# Patient Record
Sex: Female | Born: 2000 | Race: Black or African American | Hispanic: No | Smoking: Never smoker
Health system: Southern US, Community
[De-identification: ages and names within clinical notes are randomized; demographics above are authoritative.]

## PROBLEM LIST (undated history)

## (undated) DIAGNOSIS — Z789 Other specified health status: Secondary | ICD-10-CM

## (undated) DIAGNOSIS — D649 Anemia, unspecified: Secondary | ICD-10-CM

## (undated) DIAGNOSIS — A749 Chlamydial infection, unspecified: Secondary | ICD-10-CM

## (undated) DIAGNOSIS — R519 Headache, unspecified: Secondary | ICD-10-CM

## (undated) DIAGNOSIS — N390 Urinary tract infection, site not specified: Secondary | ICD-10-CM

## (undated) HISTORY — PX: NO PAST SURGERIES: SHX2092

## (undated) HISTORY — PX: THERAPEUTIC ABORTION: SHX798

---
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
## 2019-10-17 NOTE — L&D Delivery Note (Signed)
Delivery Note  Date of delivery: 05/02/2020 Estimated Date of Delivery: 05/08/20 Patient's last menstrual period was 08/02/2019. EGA: [redacted]w[redacted]d  Delivery Note At 9:17 AM a viable female was delivered via Vaginal, Spontaneous (Presentation: Left Occiput Anterior).  APGAR: 8, 9; weight  pending.   Placenta status: Spontaneous, Intact.  Cord: 3 vessels with the following complications: None.     First Stage: Labor onset: 0630 Augmentation : Pitocin Analgesia /Anesthesia intrapartum: Epidural SROM at 0545  Carol Schmitt presented to L&D with elevated BP. She was augmented with pitocin. Epidural placed.   Second Stage: Complete dilation at 0900 Onset of pushing at 0908 FHR second stage Cat II Delivery at 0917 on 05/02/2020  She progressed to complete and had a spontaneous vaginal birth of a live female over an intact perineum. The fetal head was delivered in OA position with restitution to LOA. No nuchal cord. 30 sec shoulder dystocia noted with resolution using McRoberts and suprapubic pressure. Anterior then posterior shoulders delivered. Baby placed on mom's abdomen and attended to by transition RN. Cord clamped and cut when pulseless by FOB.   Third Stage: Placenta delivered intact with 3VC at 0929 Placenta disposition: routine disposal Uterine tone boggy / bleeding heavy IV pitocin given  1000mg  Cytotec Stage 1 PPH order set started Bleeding resolved and uterine tone was firm and bleeding appropriate  Periurethreal skidmark laceration identified  Anesthesia for repair: Epidural Repair none Est. Blood Loss (mL): 1745  Complications: Shoulder dystocia and PPH  Mom to postpartum.  Baby to Couplet care / Skin to Skin.  Newborn: Birth Weight: pending  Apgar Scores: 8,9 Feeding planned: breastfeeding   , CNM 05/02/2020 9:52 AM

## 2019-12-02 ENCOUNTER — Other Ambulatory Visit: Payer: Self-pay | Admitting: Physician Assistant

## 2019-12-02 DIAGNOSIS — Z3201 Encounter for pregnancy test, result positive: Secondary | ICD-10-CM

## 2020-01-15 ENCOUNTER — Ambulatory Visit: Payer: Medicaid Other

## 2020-02-11 LAB — OB RESULTS CONSOLE HIV ANTIBODY (ROUTINE TESTING): HIV: NONREACTIVE

## 2020-02-11 LAB — OB RESULTS CONSOLE RPR: RPR: NONREACTIVE

## 2020-03-01 ENCOUNTER — Other Ambulatory Visit: Payer: Self-pay

## 2020-03-01 ENCOUNTER — Ambulatory Visit
Admission: RE | Admit: 2020-03-01 | Discharge: 2020-03-01 | Disposition: A | Discharge status: Home / Self Care | Payer: Medicaid Other | Source: Ambulatory Visit | Attending: Physician Assistant | Admitting: Physician Assistant

## 2020-03-01 DIAGNOSIS — Z3201 Encounter for pregnancy test, result positive: Secondary | ICD-10-CM | POA: Diagnosis present

## 2020-03-24 ENCOUNTER — Observation Stay
Admission: EM | Admit: 2020-03-24 | Discharge: 2020-03-24 | Disposition: A | Discharge status: Home / Self Care | Payer: Medicaid Other | Attending: Obstetrics and Gynecology | Admitting: Obstetrics and Gynecology

## 2020-03-24 ENCOUNTER — Other Ambulatory Visit: Payer: Self-pay

## 2020-03-24 DIAGNOSIS — R1030 Lower abdominal pain, unspecified: Secondary | ICD-10-CM | POA: Diagnosis present

## 2020-03-24 DIAGNOSIS — Z3A33 33 weeks gestation of pregnancy: Secondary | ICD-10-CM | POA: Diagnosis not present

## 2020-03-24 DIAGNOSIS — O26893 Other specified pregnancy related conditions, third trimester: Secondary | ICD-10-CM | POA: Diagnosis not present

## 2020-03-24 LAB — URINALYSIS, COMPLETE (UACMP) WITH MICROSCOPIC
Bilirubin Urine: NEGATIVE
Glucose, UA: NEGATIVE mg/dL
Ketones, ur: NEGATIVE mg/dL
Nitrite: NEGATIVE
Protein, ur: 30 mg/dL — AB
Specific Gravity, Urine: 1.026 (ref 1.005–1.030)
pH: 6 (ref 5.0–8.0)

## 2020-03-24 LAB — WET PREP, GENITAL
Clue Cells Wet Prep HPF POC: NONE SEEN
Sperm: NONE SEEN
Trich, Wet Prep: NONE SEEN

## 2020-03-24 MED ORDER — TERCONAZOLE 0.4 % VA CREA: 1.0000 | TOPICAL_CREAM | Freq: Every day | VAGINAL | 0 refills | Status: AC

## 2020-03-24 MED ORDER — ACETAMINOPHEN 325 MG PO TABS: 650.0000 mg | ORAL_TABLET | ORAL | Status: DC | PRN

## 2020-03-24 NOTE — Progress Notes (Signed)
Spoke to [facility] and requested prenatal records. They stated that they would fax prenatal records to Labor and Delivery and upload to the patient's chart.

## 2020-03-24 NOTE — OB Triage Note (Signed)
Patient G1P0 [redacted]w[redacted]d (pt reported) presents to labor and delivery with complaints of discomfort in groin area since 0600. Patient states she has the pain when she is walking and with certain movements. She denies contractions, leaking of fluid and vaginal bleeding and reports + fetal movement. Patient reports receiving prenatal care at [facility]. Monitors applied and assessing and vital signs stable. Patient denies any further needs at this time.

## 2020-03-24 NOTE — Discharge Instructions (Signed)

## 2020-03-24 NOTE — Progress Notes (Signed)
RN reviewed discharge instructions with patient and patient verbalized understanding. Patient had no further questions.

## 2020-03-24 NOTE — Discharge Summary (Addendum)
Patient ID: MAYURI STAPLES MRN: [id number] DOB/AGE: Aug 16, 2001 19 y.o.  Admit date: 03/24/2020 Discharge date: 03/24/2020  Admission Diagnoses: Lower abdominal pain with movement, vulvar pain with wiping after a void  Discharge Diagnoses: RL pain and yeast infection  Prenatal Procedures: NST and labs  Consults: None  Significant Diagnostic Studies:  Results for orders placed or performed during the hospital encounter of 03/24/20 (from the past 168 hour(s))  Wet prep, genital   Collection Time: 03/24/20 11:02 AM  Result Value Ref Range   Yeast Wet Prep HPF POC PRESENT (A) NONE SEEN   Trich, Wet Prep NONE SEEN NONE SEEN   Clue Cells Wet Prep HPF POC NONE SEEN NONE SEEN   WBC, Wet Prep HPF POC MANY (A) NONE SEEN   Sperm NONE SEEN   Urinalysis, Complete w Microscopic   Collection Time: 03/24/20 11:02 AM  Result Value Ref Range   Color, Urine YELLOW (A) YELLOW   APPearance CLOUDY (A) CLEAR   Specific Gravity, Urine 1.026 1.005 - 1.030   pH 6.0 5.0 - 8.0   Glucose, UA NEGATIVE NEGATIVE mg/dL   Hgb urine dipstick SMALL (A) NEGATIVE   Bilirubin Urine NEGATIVE NEGATIVE   Ketones, ur NEGATIVE NEGATIVE mg/dL   Protein, ur 30 (A) NEGATIVE mg/dL   Nitrite NEGATIVE NEGATIVE   Leukocytes,Ua LARGE (A) NEGATIVE   RBC / HPF 6-10 0 - 5 RBC/hpf   WBC, UA 11-20 0 - 5 WBC/hpf   Bacteria, UA RARE (A) NONE SEEN   Squamous Epithelial / LPF 11-20 0 - 5   Mucus PRESENT     Treatments: Rx for [facility] Hospital Course:  This is a 19 y.o. G1P0 with IUP at [redacted]w[redacted]d seen for lower abdominal pain with movement, an dvulvar pain with wiping after a void.  Pt reported no leaking of fluid, bleeding or UCs.  Wet prep and UA/culture collected.    She was observed, fetal heart rate monitoring remained reassuring, and she had no signs/symptoms of preterm labor or other maternal-fetal concerns.  She was deemed stable for discharge to home with outpatient follow up with CD - appt with them already scheduled for  6/16.  Discharge Physical Exam:  BP 128/78 (BP Location: Right Arm)   Pulse 72   Temp 98.1 F (36.7 C) (Oral)   Resp 16   Ht 5\' 3"  (1.6 m)   Wt 69.9 kg   BMI 27.28 kg/m   General: NAD CV: RRR Pulm: nl effort ABD: s/nd/nt, gravid DVT Evaluation: LE non-ttp, no evidence of DVT on exam.  SVE: deferred FHT: FHR: 135 bpm, variability: moderate,  accelerations:  Present,  decelerations:  Absent Category/reactivity:  Appropriate for GA TOCO: Occasional    Discharge Condition: Stable  Disposition: Discharge disposition: 01-Home or Self Care        Allergies as of 03/24/2020      Reactions   Apple Other (See Comments)   Mouth discomfort      Medication List    TAKE these medications   multivitamin-prenatal 27-0.8 MG Tabs tablet Take 1 tablet by mouth daily at 12 noon.   terconazole 0.4 % vaginal cream Commonly known as: Terazol 7 Place 1 applicator vaginally at bedtime for 7 days.      Follow-up [place], [facility] Call in 1 day(s).   Specialty: General Practice Contact information: [place] [address] [phone]           Signed:  Haroldine Laws, CNM 03/24/2020 11:49 AM

## 2020-03-25 LAB — URINE CULTURE: Culture: 20000 — AB

## 2020-03-26 ENCOUNTER — Other Ambulatory Visit: Payer: Self-pay | Admitting: Physician Assistant

## 2020-03-26 DIAGNOSIS — Z3403 Encounter for supervision of normal first pregnancy, third trimester: Secondary | ICD-10-CM

## 2020-03-29 ENCOUNTER — Telehealth: Payer: Self-pay | Admitting: Radiology

## 2020-04-06 ENCOUNTER — Other Ambulatory Visit: Payer: Self-pay | Admitting: Physician Assistant

## 2020-04-06 DIAGNOSIS — IMO0002 Reserved for concepts with insufficient information to code with codable children: Secondary | ICD-10-CM

## 2020-04-06 DIAGNOSIS — Z3403 Encounter for supervision of normal first pregnancy, third trimester: Secondary | ICD-10-CM

## 2020-04-07 ENCOUNTER — Ambulatory Visit: Admission: RE | Admit: 2020-04-07 | Payer: Medicaid Other | Source: Ambulatory Visit

## 2020-04-15 ENCOUNTER — Ambulatory Visit: Payer: Medicaid Other

## 2020-04-23 ENCOUNTER — Ambulatory Visit: Admission: RE | Admit: 2020-04-23 | Payer: Medicaid Other | Source: Ambulatory Visit

## 2020-05-01 ENCOUNTER — Other Ambulatory Visit: Payer: Self-pay

## 2020-05-01 ENCOUNTER — Inpatient Hospital Stay
Admission: EM | Admit: 2020-05-01 | Discharge: 2020-05-04 | DRG: 806 | Disposition: A | Discharge status: Home / Self Care | Payer: Medicaid Other | Attending: Obstetrics and Gynecology | Admitting: Obstetrics and Gynecology

## 2020-05-01 DIAGNOSIS — D62 Acute posthemorrhagic anemia: Secondary | ICD-10-CM | POA: Diagnosis not present

## 2020-05-01 DIAGNOSIS — Z20822 Contact with and (suspected) exposure to covid-19: Secondary | ICD-10-CM | POA: Diagnosis present

## 2020-05-01 DIAGNOSIS — Z3A39 39 weeks gestation of pregnancy: Secondary | ICD-10-CM | POA: Diagnosis not present

## 2020-05-01 DIAGNOSIS — O9081 Anemia of the puerperium: Secondary | ICD-10-CM | POA: Diagnosis not present

## 2020-05-01 DIAGNOSIS — O1404 Mild to moderate pre-eclampsia, complicating childbirth: Principal | ICD-10-CM | POA: Diagnosis present

## 2020-05-01 DIAGNOSIS — O1490 Unspecified pre-eclampsia, unspecified trimester: Secondary | ICD-10-CM | POA: Diagnosis present

## 2020-05-01 DIAGNOSIS — O09299 Supervision of pregnancy with other poor reproductive or obstetric history, unspecified trimester: Secondary | ICD-10-CM | POA: Diagnosis present

## 2020-05-01 HISTORY — DX: Other specified health status: Z78.9

## 2020-05-01 LAB — COMPREHENSIVE METABOLIC PANEL
ALT: 13 U/L (ref 0–44)
AST: 16 U/L (ref 15–41)
Albumin: 2.8 g/dL — ABNORMAL LOW (ref 3.5–5.0)
Alkaline Phosphatase: 101 U/L (ref 38–126)
Anion gap: 8 (ref 5–15)
BUN: 7 mg/dL (ref 6–20)
CO2: 20 mmol/L — ABNORMAL LOW (ref 22–32)
Calcium: 8.5 mg/dL — ABNORMAL LOW (ref 8.9–10.3)
Chloride: 107 mmol/L (ref 98–111)
Creatinine, Ser: 0.72 mg/dL (ref 0.44–1.00)
GFR calc Af Amer: >60 mL/min (ref >60)
GFR calc non Af Amer: >60 mL/min (ref >60)
Glucose, Bld: 79 mg/dL (ref 70–99)
Potassium: 3.9 mmol/L (ref 3.5–5.1)
Sodium: 135 mmol/L (ref 135–145)
Total Bilirubin: 0.6 mg/dL (ref 0.3–1.2)
Total Protein: 6.2 g/dL — ABNORMAL LOW (ref 6.5–8.1)

## 2020-05-01 LAB — CBC
HCT: 28.4 % — ABNORMAL LOW (ref 36.0–46.0)
Hemoglobin: 9.7 g/dL — ABNORMAL LOW (ref 12.0–15.0)
MCH: 28.4 pg (ref 26.0–34.0)
MCHC: 34.2 g/dL (ref 30.0–36.0)
MCV: 83.3 fL (ref 80.0–100.0)
Platelets: 147 10*3/uL — ABNORMAL LOW (ref 150–400)
RBC: 3.41 MIL/uL — ABNORMAL LOW (ref 3.87–5.11)
RDW: 13.2 % (ref 11.5–15.5)
WBC: 7.5 10*3/uL (ref 4.0–10.5)
nRBC: 0 % (ref 0.0–0.2)

## 2020-05-01 LAB — OB RESULTS CONSOLE HIV ANTIBODY (ROUTINE TESTING): HIV: NONREACTIVE

## 2020-05-01 LAB — RAPID HIV SCREEN (HIV 1/2 AB+AG)
HIV 1/2 Antibodies: NONREACTIVE
HIV-1 P24 Antigen - HIV24: NONREACTIVE

## 2020-05-01 LAB — PROTEIN / CREATININE RATIO, URINE
Creatinine, Urine: 160 mg/dL
Protein Creatinine Ratio: 0.71 mg/mg{Cre} — ABNORMAL HIGH (ref 0.00–0.15)
Total Protein, Urine: 114 mg/dL

## 2020-05-01 LAB — OB RESULTS CONSOLE RPR: RPR: NONREACTIVE

## 2020-05-01 LAB — GROUP B STREP BY PCR: Group B strep by PCR: NEGATIVE

## 2020-05-01 LAB — SARS CORONAVIRUS 2 BY RT PCR (HOSPITAL ORDER, PERFORMED IN ~~LOC~~ HOSPITAL LAB): SARS Coronavirus 2: NEGATIVE

## 2020-05-01 LAB — ABO/RH: ABO/RH(D): B POS

## 2020-05-01 LAB — HEPATITIS B SURFACE ANTIGEN: Hepatitis B Surface Ag: NONREACTIVE

## 2020-05-01 MED ORDER — AMMONIA AROMATIC IN INHA
RESPIRATORY_TRACT | Status: AC
  Filled 2020-05-01: qty 10

## 2020-05-01 MED ORDER — MISOPROSTOL 25 MCG QUARTER TABLET: 25.0000 ug | ORAL_TABLET | ORAL | Status: DC

## 2020-05-01 MED ORDER — MISOPROSTOL 25 MCG QUARTER TABLET
ORAL_TABLET | ORAL | Status: AC
  Administered 2020-05-01: 25 ug via BUCCAL
  Filled 2020-05-01: qty 2

## 2020-05-01 MED ORDER — SOD CITRATE-CITRIC ACID 500-334 MG/5ML PO SOLN: 30.0000 mL | ORAL | Status: DC | PRN

## 2020-05-01 MED ORDER — OXYCODONE-ACETAMINOPHEN 5-325 MG PO TABS: 2.0000 | ORAL_TABLET | ORAL | Status: DC | PRN

## 2020-05-01 MED ORDER — MISOPROSTOL 200 MCG PO TABS
ORAL_TABLET | ORAL | Status: AC
  Administered 2020-05-01: 25 ug via VAGINAL
  Filled 2020-05-01: qty 4

## 2020-05-01 MED ORDER — LACTATED RINGERS IV SOLN
500.0000 mL | INTRAVENOUS | Status: DC | PRN
  Administered 2020-05-02: 250 mL via INTRAVENOUS

## 2020-05-01 MED ORDER — ACETAMINOPHEN 325 MG PO TABS: 650.0000 mg | ORAL_TABLET | ORAL | Status: DC | PRN

## 2020-05-01 MED ORDER — FENTANYL CITRATE (PF) 100 MCG/2ML IJ SOLN
INTRAMUSCULAR | Status: AC
  Administered 2020-05-02: 50 ug via INTRAVENOUS
  Filled 2020-05-01: qty 2

## 2020-05-01 MED ORDER — OXYTOCIN-SODIUM CHLORIDE 30-0.9 UT/500ML-% IV SOLN
1.0000 m[IU]/min | INTRAVENOUS | Status: DC
  Administered 2020-05-01: 2 m[IU]/min via INTRAVENOUS

## 2020-05-01 MED ORDER — PENICILLIN G POT IN DEXTROSE 60000 UNIT/ML IV SOLN: 3.0000 10*6.[IU] | INTRAVENOUS | Status: DC

## 2020-05-01 MED ORDER — CALCIUM CARBONATE ANTACID 500 MG PO CHEW: 2.0000 | CHEWABLE_TABLET | ORAL | Status: DC | PRN

## 2020-05-01 MED ORDER — TERBUTALINE SULFATE 1 MG/ML IJ SOLN: 0.2500 mg | Freq: Once | INTRAMUSCULAR | Status: DC | PRN

## 2020-05-01 MED ORDER — SODIUM CHLORIDE 0.9 % IV SOLN: 5.0000 10*6.[IU] | Freq: Once | INTRAVENOUS | Status: DC

## 2020-05-01 MED ORDER — OXYCODONE-ACETAMINOPHEN 5-325 MG PO TABS: 1.0000 | ORAL_TABLET | ORAL | Status: DC | PRN

## 2020-05-01 MED ORDER — OXYTOCIN BOLUS FROM INFUSION
333.0000 mL | Freq: Once | INTRAVENOUS | Status: AC
  Administered 2020-05-02: 333 mL via INTRAVENOUS

## 2020-05-01 MED ORDER — LIDOCAINE HCL (PF) 1 % IJ SOLN: 30.0000 mL | INTRAMUSCULAR | Status: DC | PRN

## 2020-05-01 MED ORDER — LIDOCAINE HCL (PF) 1 % IJ SOLN
INTRAMUSCULAR | Status: AC
  Filled 2020-05-01: qty 30

## 2020-05-01 MED ORDER — FENTANYL CITRATE (PF) 100 MCG/2ML IJ SOLN
50.0000 ug | INTRAMUSCULAR | Status: AC | PRN
  Administered 2020-05-01 – 2020-05-02 (×3): 50 ug via INTRAVENOUS
  Filled 2020-05-01 (×3): qty 2

## 2020-05-01 MED ORDER — ONDANSETRON HCL 4 MG/2ML IJ SOLN: 4.0000 mg | Freq: Four times a day (QID) | INTRAMUSCULAR | Status: DC | PRN

## 2020-05-01 MED ORDER — LACTATED RINGERS IV SOLN: INTRAVENOUS | Status: DC

## 2020-05-01 MED ORDER — MISOPROSTOL 25 MCG QUARTER TABLET: 25.0000 ug | ORAL_TABLET | ORAL | Status: DC | PRN

## 2020-05-01 MED ORDER — OXYTOCIN-SODIUM CHLORIDE 30-0.9 UT/500ML-% IV SOLN
2.5000 [IU]/h | INTRAVENOUS | Status: DC
  Administered 2020-05-02: 2.5 [IU]/h via INTRAVENOUS
  Filled 2020-05-01 (×2): qty 500

## 2020-05-01 MED ORDER — OXYTOCIN 10 UNIT/ML IJ SOLN
INTRAMUSCULAR | Status: AC
  Filled 2020-05-01: qty 2

## 2020-05-01 NOTE — OB Triage Provider Note (Addendum)
OB History & Physical   History of Present Illness:  Chief Complaint:   HPI:  Carol Schmitt is a 19 y.o. G1P0 female at [redacted]w[redacted]d dated by LMP.  She presents to L&D for labor check.  Upon admission she was admitted for Pre-eclampsia without severe features.  Prenatal records unavailable, however pt reports that at her last [facility] appt yesterday she was told she had elevated blood pressure at the time.  She reports:  -active fetal movement -no leakage of fluid -no vaginal bleeding -onset of contractions at 2000 currently every irregular   Maternal Medical History:   Past Medical History:  Diagnosis Date   Medical history non-contributory     Past Surgical History:  Procedure Laterality Date   NO PAST SURGERIES      Allergies  Allergen Reactions   Apple Other (See Comments)    Mouth discomfort    Prior to Admission medications   Medication Sig Start Date End Date Taking? Authorizing Provider  Prenatal Vit-Fe Fumarate-FA (MULTIVITAMIN-PRENATAL) 27-0.8 MG TABS tablet Take 1 tablet by mouth daily at 12 noon.   Yes [provider]     [facility]  Social History: She  reports that she has never smoked. She has never used smokeless tobacco. She reports previous alcohol use. She reports previous drug use.  Family History: family history is not on file.   Review of Systems: A full review of systems was performed and negative except as noted in the HPI.    Physical Exam:  Vital Signs: BP (!) 145/99    Pulse 74    Temp 98.3 F (36.8 C) (Oral)    Resp 16    Ht 5\' 3"  (1.6 m)    Wt 73.5 kg    SpO2 100%    Breastfeeding Unknown    BMI 28.70 kg/m   General:   alert and cooperative  Skin:  normal  Neurologic:    Alert & oriented x 3  Lungs:   nl effort  Heart:   regular rate and rhythm  Abdomen:  normal findings: soft, non-tender  Extremities: : non-tender, symmetric     Results for orders placed or performed during the hospital encounter of  05/01/20 (from the past 24 hour(s))  Protein / creatinine ratio, urine     Status: Abnormal   Collection Time: 05/01/20  1:01 PM  Result Value Ref Range   Creatinine, Urine 160 mg/dL   Total Protein, Urine 114 mg/dL   Protein Creatinine Ratio 0.71 (H) 0.00 - 0.15 mg/mg[Cre]  Type and screen [facility]     Status: None (Preliminary result)   Collection Time: 05/01/20  1:11 PM  Result Value Ref Range   ABO/RH(D) PENDING    Antibody Screen PENDING    Sample Expiration      05/04/2020,2359 Performed at [facility], [address] [place], [place]   Comprehensive metabolic panel     Status: Abnormal   Collection Time: 05/01/20  1:11 PM  Result Value Ref Range   Sodium 135 135 - 145 mmol/L   Potassium 3.9 3.5 - 5.1 mmol/L   Chloride 107 98 - 111 mmol/L   CO2 20 (L) 22 - 32 mmol/L   Glucose, Bld 79 70 - 99 mg/dL   BUN 7 6 - 20 mg/dL   Creatinine, Ser 05/03/20 0.44 - 1.00 mg/dL   Calcium 8.5 (L) 8.9 - 10.3 mg/dL   Total Protein 6.2 (L) 6.5 - 8.1  g/dL   Albumin 2.8 (L) 3.5 - 5.0 g/dL   AST 16 15 - 41 U/L   ALT 13 0 - 44 U/L   Alkaline Phosphatase 101 38 - 126 U/L   Total Bilirubin 0.6 0.3 - 1.2 mg/dL   GFR calc non Af Amer >60 >60 mL/min   GFR calc Af Amer >60 >60 mL/min   Anion gap 8 5 - 15  CBC     Status: Abnormal   Collection Time: 05/01/20  1:11 PM  Result Value Ref Range   WBC 7.5 4.0 - 10.5 K/uL   RBC 3.41 (L) 3.87 - 5.11 MIL/uL   Hemoglobin 9.7 (L) 12.0 - 15.0 g/dL   HCT 25.4 (L) 36 - 46 %   MCV 83.3 80.0 - 100.0 fL   MCH 28.4 26.0 - 34.0 pg   MCHC 34.2 30.0 - 36.0 g/dL   RDW 98.2 64.1 - 58.3 %   Platelets 147 (L) 150 - 400 K/uL   nRBC 0.0 0.0 - 0.2 %    Pertinent Results:  Prenatal Labs: Blood type/Rh B pos  Antibody screen neg  Rubella Pending  Varicella Pending  RPR Pending  HBsAg Pending  HIV Pending  GC   Chlamydia   Genetic screening   1 hour GTT   3 hour GTT   GBS Pending   FHT: FHR: 140 bpm, variability:  moderate,  accelerations:  Present,  decelerations:  Absent Category/reactivity:  Category I TOCO: Occasional SVE: Dilation: 1 / Effacement (%): Thick / Station: Agricultural consultant by SVE   Assessment:  Carol Schmitt is a 19 y.o. G1P0 female at [redacted]w[redacted]d with pre-eclampsia without severe features, admitting for IOL.  Plan:  1. Admit to Labor & Delivery; consents reviewed and obtained  2. Pre-E - PCR 710, AST/ALT WNL, Ptl 147 - Dr. Dalbert Garnet updated  3. Fetal Well being  - Fetal Tracing: Cat I - GBS unknown, swab collected on admission - Presentation: vtx confirmed by SVE   4. Routine OB: - Prenatal labs reviewed, as above - Rh pos - CBC & T&S on admit - Clear fluids, IVF  5. Induction of Labor -  Contractions external toco in place -  Plan for induction with Cytotec -  Plan for continuous fetal monitoring  -  Maternal pain control as desired: IVPM, nitrous, regional anesthesia - Anticipate vaginal delivery  6. Post Partum Planning: - Infant feeding: unknown - Contraception: TBD  Devereaux Grayson, CNM 05/01/2020 2:07 PM

## 2020-05-01 NOTE — Progress Notes (Signed)
Labor Progress Note  Carol Schmitt is a 19 y.o. G1P0 at [redacted]w[redacted]d by LMP admitted for induction of labor due to pre-e..  Subjective: In depth discussion of pre-e, mag sulfate, and methods of induction. Pt verbalized understanding and said she didn't have any questions.   Objective: BP (!) 152/97 (BP Location: Right Arm)   Pulse 84   Temp 98 F (36.7 C) (Oral)   Resp 16   Ht 5\' 3"  (1.6 m)   Wt 73.5 kg   SpO2 100%   Breastfeeding Unknown   BMI 28.70 kg/m    Fetal Assessment: FHT:  FHR: 130 bpm, variability: moderate,  accelerations:  Present,  decelerations:  Absent Category/reactivity:  Category I UC:   regular, every 8-10 minutes SVE:   Dilation: 1 Effacement (%): 50 Cervical Position: Posterior Station: -2 Presentation: Vertex Exam by:: Airi Copado CNM Membrane status:Intact Amniotic color: n/a  Vitals with BMI 05/01/2020 05/01/2020 05/01/2020  Height - - -  Weight - - -  BMI - - -  Systolic 152 129 05/03/2020  Diastolic 97 96 88  Pulse 84 94 81   .result Assessment / Plan: Induction of labor due to preeclampsia,  1st dose of cytotec at 1630  Labor: Cytotec given Preeclampsia:  no signs or symptoms of toxicity and BPs above, initial labs completed. Fetal Wellbeing:  Category I Pain Control:  Labor support without medications I/D:  Afebrile, GBS neg, Intact Anticipated MOD:  NSVD  683, CNM 05/01/2020, 4:34 PM

## 2020-05-01 NOTE — Progress Notes (Signed)
CNM in department. CNM notified of SVE and Elevated BP on assessment and orders received for labs.

## 2020-05-01 NOTE — OB Triage Note (Addendum)
Pt is a 19yo G1P0 at [redacted]w[redacted]d that presents from ED with c/o ctx that started around 2000 last night and were coming every 5-10 minutes. Pt states "I  was able to sleep some but when I would get up to pee I would still feel them." Pt states when she woke up this morning she was still contracting but unsure of how often they were coming. Pt denies VB LOF and states Positive FM. FM applied and FHT 135. BP 126/102 and cycling q 

## 2020-05-02 ENCOUNTER — Encounter: Payer: Self-pay | Admitting: Obstetrics and Gynecology

## 2020-05-02 ENCOUNTER — Inpatient Hospital Stay: Payer: Medicaid Other | Admitting: Anesthesiology

## 2020-05-02 DIAGNOSIS — O09299 Supervision of pregnancy with other poor reproductive or obstetric history, unspecified trimester: Secondary | ICD-10-CM | POA: Diagnosis not present

## 2020-05-02 LAB — CHLAMYDIA/NGC RT PCR (ARMC ONLY)
Chlamydia Tr: NOT DETECTED
N gonorrhoeae: NOT DETECTED

## 2020-05-02 LAB — CBC
HCT: 24.5 % — ABNORMAL LOW (ref 36.0–46.0)
Hemoglobin: 8 g/dL — ABNORMAL LOW (ref 12.0–15.0)
MCH: 28.8 pg (ref 26.0–34.0)
MCHC: 32.7 g/dL (ref 30.0–36.0)
MCV: 88.1 fL (ref 80.0–100.0)
Platelets: 130 10*3/uL — ABNORMAL LOW (ref 150–400)
RBC: 2.78 MIL/uL — ABNORMAL LOW (ref 3.87–5.11)
RDW: 13 % (ref 11.5–15.5)
WBC: 13.1 10*3/uL — ABNORMAL HIGH (ref 4.0–10.5)
nRBC: 0 % (ref 0.0–0.2)

## 2020-05-02 LAB — RUBELLA SCREEN: Rubella: 1.3 index (ref >0.99)

## 2020-05-02 LAB — VARICELLA ZOSTER ANTIBODY, IGG: Varicella IgG: 306 index (ref >165)

## 2020-05-02 LAB — OB RESULTS CONSOLE GC/CHLAMYDIA
Chlamydia: NEGATIVE
Gonorrhea: NEGATIVE

## 2020-05-02 LAB — RPR: RPR Ser Ql: NONREACTIVE

## 2020-05-02 MED ORDER — BENZOCAINE-MENTHOL 20-0.5 % EX AERO: 1.0000 "application " | INHALATION_SPRAY | CUTANEOUS | Status: DC | PRN

## 2020-05-02 MED ORDER — EPHEDRINE 5 MG/ML INJ: 10.0000 mg | INTRAVENOUS | Status: DC | PRN

## 2020-05-02 MED ORDER — LACTATED RINGERS IV BOLUS
1000.0000 mL | Freq: Once | INTRAVENOUS | Status: AC
  Administered 2020-05-02: 1000 mL via INTRAVENOUS

## 2020-05-02 MED ORDER — SODIUM CHLORIDE 0.9 % IV SOLN
INTRAVENOUS | Status: DC | PRN
  Administered 2020-05-02 (×2): 5 mL via EPIDURAL

## 2020-05-02 MED ORDER — ONDANSETRON HCL 4 MG PO TABS: 4.0000 mg | ORAL_TABLET | ORAL | Status: DC | PRN

## 2020-05-02 MED ORDER — PHENYLEPHRINE 40 MCG/ML (10ML) SYRINGE FOR IV PUSH (FOR BLOOD PRESSURE SUPPORT): 80.0000 ug | PREFILLED_SYRINGE | INTRAVENOUS | Status: DC | PRN

## 2020-05-02 MED ORDER — FENTANYL 2.5 MCG/ML W/ROPIVACAINE 0.15% IN NS 100 ML EPIDURAL (ARMC)
12.0000 mL/h | EPIDURAL | Status: DC
  Administered 2020-05-02: 12 mL/h via EPIDURAL

## 2020-05-02 MED ORDER — IBUPROFEN 600 MG PO TABS
600.0000 mg | ORAL_TABLET | Freq: Four times a day (QID) | ORAL | Status: DC
  Administered 2020-05-02 – 2020-05-03 (×5): 600 mg via ORAL
  Filled 2020-05-02 (×6): qty 1

## 2020-05-02 MED ORDER — MISOPROSTOL 200 MCG PO TABS
ORAL_TABLET | ORAL | Status: AC
  Administered 2020-05-02: 1000 ug
  Filled 2020-05-02: qty 2

## 2020-05-02 MED ORDER — ACETAMINOPHEN 325 MG PO TABS
650.0000 mg | ORAL_TABLET | ORAL | Status: DC | PRN
  Administered 2020-05-03 – 2020-05-04 (×3): 650 mg via ORAL
  Filled 2020-05-02 (×3): qty 2

## 2020-05-02 MED ORDER — SIMETHICONE 80 MG PO CHEW: 80.0000 mg | CHEWABLE_TABLET | ORAL | Status: DC | PRN

## 2020-05-02 MED ORDER — FERROUS SULFATE 325 (65 FE) MG PO TABS
325.0000 mg | ORAL_TABLET | Freq: Two times a day (BID) | ORAL | Status: DC
  Administered 2020-05-02 – 2020-05-04 (×5): 325 mg via ORAL
  Filled 2020-05-02 (×5): qty 1

## 2020-05-02 MED ORDER — LACTATED RINGERS IV SOLN: INTRAVENOUS | Status: DC

## 2020-05-02 MED ORDER — DIPHENHYDRAMINE HCL 25 MG PO CAPS: 25.0000 mg | ORAL_CAPSULE | Freq: Four times a day (QID) | ORAL | Status: DC | PRN

## 2020-05-02 MED ORDER — FENTANYL 2.5 MCG/ML W/ROPIVACAINE 0.15% IN NS 100 ML EPIDURAL (ARMC)
EPIDURAL | Status: AC
  Filled 2020-05-02: qty 100

## 2020-05-02 MED ORDER — ONDANSETRON HCL 4 MG/2ML IJ SOLN: 4.0000 mg | INTRAMUSCULAR | Status: DC | PRN

## 2020-05-02 MED ORDER — TETANUS-DIPHTH-ACELL PERTUSSIS 5-2.5-18.5 LF-MCG/0.5 IM SUSP: 0.5000 mL | Freq: Once | INTRAMUSCULAR | Status: DC

## 2020-05-02 MED ORDER — DIBUCAINE (PERIANAL) 1 % EX OINT: 1.0000 "application " | TOPICAL_OINTMENT | CUTANEOUS | Status: DC | PRN

## 2020-05-02 MED ORDER — PRENATAL MULTIVITAMIN CH
1.0000 | ORAL_TABLET | Freq: Every day | ORAL | Status: DC
  Administered 2020-05-02 – 2020-05-04 (×3): 1 via ORAL
  Filled 2020-05-02 (×3): qty 1

## 2020-05-02 MED ORDER — COCONUT OIL OIL
1.0000 "application " | TOPICAL_OIL | Status: DC | PRN
  Administered 2020-05-03: 1 via TOPICAL
  Filled 2020-05-02: qty 120

## 2020-05-02 MED ORDER — DOCUSATE SODIUM 100 MG PO CAPS
100.0000 mg | ORAL_CAPSULE | Freq: Two times a day (BID) | ORAL | Status: DC
  Administered 2020-05-02 – 2020-05-04 (×3): 100 mg via ORAL
  Filled 2020-05-02 (×3): qty 1

## 2020-05-02 MED ORDER — LIDOCAINE-EPINEPHRINE (PF) 1.5 %-1:200000 IJ SOLN
INTRAMUSCULAR | Status: DC | PRN
  Administered 2020-05-02: 3 mL via EPIDURAL

## 2020-05-02 MED ORDER — LIDOCAINE HCL (PF) 1 % IJ SOLN
INTRAMUSCULAR | Status: DC | PRN
  Administered 2020-05-02: 1 mL via INTRADERMAL

## 2020-05-02 MED ORDER — WITCH HAZEL-GLYCERIN EX PADS: 1.0000 "application " | MEDICATED_PAD | CUTANEOUS | Status: DC | PRN

## 2020-05-02 MED ORDER — DIPHENHYDRAMINE HCL 50 MG/ML IJ SOLN: 12.5000 mg | INTRAMUSCULAR | Status: DC | PRN

## 2020-05-02 MED ORDER — LACTATED RINGERS IV SOLN: 500.0000 mL | Freq: Once | INTRAVENOUS | Status: DC

## 2020-05-02 NOTE — Discharge Summary (Signed)
Obstetrical Discharge Summary  Patient Name: Carol Schmitt DOB: June 25, 2001 MRN: [id number]  Date of Admission: 05/01/2020 Date of Delivery: 05/02/20 Delivered by: Haroldine Laws Date of Discharge: 05/04/2020  [facility]: [facility]  VZC:HYIFOYD'X last menstrual period was 08/02/2019. EDC Estimated Date of Delivery: 05/08/20 Gestational Age at Delivery: [redacted]w[redacted]d   Antepartum complications:  Elevated BPs in the clinic No PN records Admitting Diagnosis: IOL for Pre-E Secondary Diagnosis: Patient Active Problem List   Diagnosis Date Noted  . NSVD (normal spontaneous vaginal delivery) 05/02/2020  . Postpartum hemorrhage 05/02/2020  . Shoulder dystocia, delivered 05/02/2020  . Pre-eclampsia 05/01/2020  . Groin pain 03/24/2020    Augmentation: Pitocin Complications: Hemorrhage>1059mL  Intrapartum complications/course:  Delivery Type: spontaneous vaginal delivery Anesthesia: epidural Placenta: spontaneous Laceration: Small periurethral skidmark Episiotomy: none Newborn Data: Live born female   APGAR: 8, 47 "Jaylen"   Newborn Delivery   Time head delivered: 05/02/2020 09:17:00 Birth date/time: 05/02/2020 09:17:00 Delivery type: Vaginal, Spontaneous      19yo G4P1021 at 39+4wks presenting with elevated BPs with a diagnosis of Pre-E, SROM with clear fluid.  She progressed to complete and pushed over an intact perineum and delivered the fetal head.  30 sec shoulder dystocia. She was in control the whole time, and the baby placed on the maternal abdomen. Delayed cord clamping and the FOB cut his cord, while he was skin to skin. The placenta delivered spontaneously and intact. Small infraclitoral laceration that was hemostatic and did not require a stitch. PPH of with IV Pitocin and of Cytotec.  Mom and baby tolerated the procedure well.   Postpartum Procedures: n/a  Post partum course:  Patient had an uncomplicated postpartum course.  By time of discharge on PPD#2,  her pain was controlled on oral pain medications; she had appropriate lochia and was ambulating, voiding without difficulty and tolerating regular diet.  She was deemed stable for discharge to home.   D/c with nifedipine er 90mg  po with f/u in 1 week for bp check  Discharge Physical Exam:  BP (!) 124/95 (BP Location: Right Arm) Comment: nurse K. Thomas notified  Pulse 94   Temp 98 F (36.7 C) (Oral)   Resp 18   Ht 5\' 3"  (1.6 m)   Wt 73.7 kg   LMP 08/02/2019   SpO2 100% Comment: Room Air  Breastfeeding Unknown   BMI 28.76 kg/m   General: alert and no distress Pulm: normal respiratory effort Lochia: appropriate Abdomen: soft, NT Uterine Fundus: firm, below umbilicus Incision: c/d/i, healing well, no significant drainage, no dehiscence, no significant erythema Extremities: No evidence of DVT seen on physical exam. No lower extremity edema. Edinburgh:  Edinburgh Postnatal Depression Scale Screening Tool 05/03/2020 05/02/2020  I have been able to laugh and see the funny side of things. 0 (No Data)  I have looked forward with enjoyment to things. 0 -  I have blamed myself unnecessarily when things went wrong. 0 -  I have been anxious or worried for no good reason. 3 -  I have felt scared or panicky for no good reason. 0 -  Things have been getting on top of me. 1 -  I have been so unhappy that I have had difficulty sleeping. 0 -  I have felt sad or miserable. 0 -  I have been so unhappy that I have been crying. 0 -  The thought of harming myself has occurred to me. 0 -  Edinburgh Postnatal Depression Scale Total 4 -  Labs: CBC Latest Ref Rng & Units 05/04/2020 05/03/2020 05/03/2020  WBC 4.0 - 10.5 K/uL 7.0 - 8.4  Hemoglobin 12.0 - 15.0 g/dL 2.3(J) 6.2(G) 6.8(L)  Hematocrit 36 - 46 % 26.3(L) 24.1(L) 19.3(L)  Platelets 150 - 400 K/uL 135(L) - 113(L)   B POS Performed at [facility], [address] [place], [place] [address]  Hemoglobin  Date Value Ref Range Status   05/04/2020 9.1 (L) 12.0 - 15.0 g/dL Final   HCT  Date Value Ref Range Status  05/04/2020 26.3 (L) 36 - 46 % Final    Disposition: stable, discharge to home Baby Feeding: breastmilk Baby Disposition: home with mom  Contraception: TBD  Prenatal Labs:  Blood type/Rh B pos  Antibody screen neg  Rubella Pending  Varicella Pending  RPR Pending  HBsAg NR  HIV NR  GC Neg  Chlamydia Neg  Genetic screening   1 hour GTT   3 hour GTT   GBS Neg   Rh Immune globulin given: n/a Rubella vaccine given: labs pending  Varicella vaccine given: labs pending  Tdap vaccine given in AP or PP setting: Unknown Flu vaccine given in AP or PP setting: Unknown  Plan: Carol Schmitt was discharged to home in good condition. Follow-up appointment with delivering provider in 6 weeks.  Discharge Instructions: Per After Visit Summary. Activity: Advance as tolerated. Pelvic rest for 6 weeks.   Diet: Regular Discharge Medications: Allergies as of 05/04/2020      Reactions   Apple Other (See Comments)   Mouth discomfort      Medication List    TAKE these medications   ferrous sulfate 325 (65 FE) MG tablet Take 1 tablet (325 mg total) by mouth daily with breakfast. Take with Vitamin C   multivitamin-prenatal 27-0.8 MG Tabs tablet Take 1 tablet by mouth daily at 12 noon.   NIFEdipine 30 MG 24 hr tablet Commonly known as: PROCARDIA-XL/NIFEDICAL-XL C      Outpatient follow up:   Signed:  05/04/2020 6:36 PM Christeen Douglas

## 2020-05-02 NOTE — Anesthesia Procedure Notes (Signed)
Epidural Patient location during procedure: OB Start time: 05/02/2020 6:19 AM End time: 05/02/2020 6:22 AM  Staffing Anesthesiologist: Alexiana Laverdure, Cleda Mccreedy, MD Performed: anesthesiologist   Preanesthetic Checklist Completed: patient identified, IV checked, site marked, risks and benefits discussed, surgical consent, monitors and equipment checked, pre-op evaluation and timeout performed  Epidural Patient position: sitting Prep: ChloraPrep Patient monitoring: heart rate, continuous pulse ox and blood pressure Approach: midline Location: L3-L4 Injection technique: LOR saline  Needle:  Needle type: Tuohy  Needle gauge: 17 G Needle length: 9 cm and 9 Needle insertion depth: 6 cm Catheter type: closed end flexible Catheter size: 19 Gauge Catheter at skin depth: 11 cm Test dose: negative and 1.5% lidocaine with Epi 1:200 K  Assessment Sensory level: T10 Events: blood not aspirated, injection not painful, no injection resistance, no paresthesia and negative IV test  Additional Notes 1 attempt Pt. Evaluated and documentation done after procedure finished. Patient identified. Risks/Benefits/Options discussed with patient including but not limited to bleeding, infection, nerve damage, paralysis, failed block, incomplete pain control, headache, blood pressure changes, nausea, vomiting, reactions to medication both or allergic, itching and postpartum back pain. Confirmed with bedside nurse the patient's most recent platelet count. Confirmed with patient that they are not currently taking any anticoagulation, have any bleeding history or any family history of bleeding disorders. Patient expressed understanding and wished to proceed. All questions were answered. Sterile technique was used throughout the entire procedure. Please see nursing notes for vital signs. Test dose was given through epidural catheter and negative prior to continuing to dose epidural or start infusion. Warning signs of  high block given to the patient including shortness of breath, tingling/numbness in hands, complete motor block, or any concerning symptoms with instructions to call for help. Patient was given instructions on fall risk and not to get out of bed. All questions and concerns addressed with instructions to call with any issues or inadequate analgesia.   Patient tolerated the insertion well without immediate complications.Reason for block:procedure for pain

## 2020-05-02 NOTE — Progress Notes (Addendum)
Labor Progress Note  Carol Schmitt is a 19 y.o. G1P0 at [redacted]w[redacted]d by LMP admitted for induction of labor due to pre-e..  Subjective: Pt reports increased UC intensity.  Objective: BP 135/84   Pulse 69   Temp 98 F (36.7 C) (Oral)   Resp 17   Ht 5\' 3"  (1.6 m)   Wt 73.5 kg   SpO2 100%   Breastfeeding Unknown   BMI 28.70 kg/m    Fetal Assessment: FHT:  FHR: 140 bpm, variability: moderate,  accelerations:  Present,  decelerations:  Absent Category/reactivity:  Category I UC:   regular, every 1-2 minutes SVE:   Dilation: 2 Effacement (%): 50, 60 Cervical Position: Posterior Station: -2 Presentation: Vertex Exam by:: [email or web address], RN Membrane status:Intact Amniotic color: n/a  Vitals with BMI 05/01/2020 05/01/2020 05/01/2020  Height - - -  Weight - - -  BMI - - -  Systolic 135 156 05/03/2020  Diastolic 84 93 71  Pulse 69 68 62    Assessment / Plan: Induction of labor due to preeclampsia,  1st dose of cytotec at 1630. Pitocin started at   Labor: Pitocin started at 2315 Preeclampsia:  no signs or symptoms of toxicity and BPs above Fetal Wellbeing:  Category I Pain Control:  IV pain meds, Fentanyl 1947 and 2057 I/D:  Afebrile, GBS neg, Intact Anticipated MOD:  NSVD  Jenifer E Terrie Haring, CNM 05/02/2020, 12:12 AM

## 2020-05-02 NOTE — Anesthesia Preprocedure Evaluation (Signed)
Anesthesia Evaluation  Patient identified by MRN, date of birth, ID band Patient awake    Reviewed: Allergy & Precautions, H&P , NPO status , Patient's Chart, lab work & pertinent test results  History of Anesthesia Complications Negative for: history of anesthetic complications  Airway Mallampati: III  TM Distance: >3 FB Neck ROM: full    Dental  (+) Chipped   Pulmonary neg pulmonary ROS,    Pulmonary exam normal        Cardiovascular Exercise Tolerance: Good hypertension, Normal cardiovascular exam     Neuro/Psych    GI/Hepatic negative GI ROS,   Endo/Other    Renal/GU   negative genitourinary   Musculoskeletal   Abdominal   Peds  Hematology negative hematology ROS (+)   Anesthesia Other Findings PreE  Past Medical History: No date: Medical history non-contributory  Past Surgical History: No date: NO PAST SURGERIES  BMI    Body Mass Index: 28.70 kg/m      Reproductive/Obstetrics (+) Pregnancy                             Anesthesia Physical Anesthesia Plan  ASA: IV  Anesthesia Plan: Epidural   Post-op Pain Management:    Induction:   PONV Risk Score and Plan:   Airway Management Planned: Natural Airway  Additional Equipment:   Intra-op Plan:   Post-operative Plan:   Informed Consent: I have reviewed the patients History and Physical, chart, labs and discussed the procedure including the risks, benefits and alternatives for the proposed anesthesia with the patient or authorized representative who has indicated his/her understanding and acceptance.     Dental Advisory Given  Plan Discussed with: Anesthesiologist  Anesthesia Plan Comments: (Patient reports no bleeding problems and no anticoagulant use.   Patient consented for risks of anesthesia including but not limited to:  - adverse reactions to medications - risk of bleeding, infection and or nerve  damage from epidural that could lead to paralysis - risk of headache or failed epidural - nerve damage due to positioning - Damage to heart, brain, lungs, other parts of body or loss of life  Patient voiced understanding.)        Anesthesia Quick Evaluation

## 2020-05-03 ENCOUNTER — Encounter: Payer: Self-pay | Admitting: Obstetrics and Gynecology

## 2020-05-03 LAB — COMPREHENSIVE METABOLIC PANEL
ALT: 13 U/L (ref 0–44)
AST: 22 U/L (ref 15–41)
Albumin: 2 g/dL — ABNORMAL LOW (ref 3.5–5.0)
Alkaline Phosphatase: 74 U/L (ref 38–126)
Anion gap: 4 — ABNORMAL LOW (ref 5–15)
BUN: 7 mg/dL (ref 6–20)
CO2: 24 mmol/L (ref 22–32)
Calcium: 8 mg/dL — ABNORMAL LOW (ref 8.9–10.3)
Chloride: 109 mmol/L (ref 98–111)
Creatinine, Ser: 0.76 mg/dL (ref 0.44–1.00)
GFR calc Af Amer: >60 mL/min (ref >60)
GFR calc non Af Amer: >60 mL/min (ref >60)
Glucose, Bld: 77 mg/dL (ref 70–99)
Potassium: 3.7 mmol/L (ref 3.5–5.1)
Sodium: 137 mmol/L (ref 135–145)
Total Bilirubin: 0.4 mg/dL (ref 0.3–1.2)
Total Protein: 4.3 g/dL — ABNORMAL LOW (ref 6.5–8.1)

## 2020-05-03 LAB — CBC
HCT: 19.3 % — ABNORMAL LOW (ref 36.0–46.0)
Hemoglobin: 6.8 g/dL — ABNORMAL LOW (ref 12.0–15.0)
MCH: 30 pg (ref 26.0–34.0)
MCHC: 35.2 g/dL (ref 30.0–36.0)
MCV: 85 fL (ref 80.0–100.0)
Platelets: 113 10*3/uL — ABNORMAL LOW (ref 150–400)
RBC: 2.27 MIL/uL — ABNORMAL LOW (ref 3.87–5.11)
RDW: 13.3 % (ref 11.5–15.5)
WBC: 8.4 10*3/uL (ref 4.0–10.5)
nRBC: 0 % (ref 0.0–0.2)

## 2020-05-03 LAB — PREPARE RBC (CROSSMATCH)

## 2020-05-03 LAB — HEMOGLOBIN AND HEMATOCRIT, BLOOD
HCT: 24.1 % — ABNORMAL LOW (ref 36.0–46.0)
Hemoglobin: 8.6 g/dL — ABNORMAL LOW (ref 12.0–15.0)

## 2020-05-03 MED ORDER — LABETALOL HCL 5 MG/ML IV SOLN: 80.0000 mg | INTRAVENOUS | Status: DC | PRN

## 2020-05-03 MED ORDER — IBUPROFEN 600 MG PO TABS
600.0000 mg | ORAL_TABLET | Freq: Four times a day (QID) | ORAL | Status: DC
  Administered 2020-05-03 – 2020-05-04 (×4): 600 mg via ORAL
  Filled 2020-05-03 (×4): qty 1

## 2020-05-03 MED ORDER — NIFEDIPINE ER OSMOTIC RELEASE 30 MG PO TB24
60.0000 mg | ORAL_TABLET | Freq: Every day | ORAL | Status: DC
  Administered 2020-05-03: 60 mg via ORAL
  Filled 2020-05-03: qty 2

## 2020-05-03 MED ORDER — ACETAMINOPHEN 325 MG PO TABS
650.0000 mg | ORAL_TABLET | Freq: Once | ORAL | Status: AC
  Administered 2020-05-03: 650 mg via ORAL
  Filled 2020-05-03: qty 2

## 2020-05-03 MED ORDER — LABETALOL HCL 5 MG/ML IV SOLN: 20.0000 mg | INTRAVENOUS | Status: DC | PRN

## 2020-05-03 MED ORDER — DIPHENHYDRAMINE HCL 25 MG PO CAPS
25.0000 mg | ORAL_CAPSULE | Freq: Once | ORAL | Status: AC
  Administered 2020-05-03: 25 mg via ORAL
  Filled 2020-05-03: qty 1

## 2020-05-03 MED ORDER — LABETALOL HCL 5 MG/ML IV SOLN: 40.0000 mg | INTRAVENOUS | Status: DC | PRN

## 2020-05-03 MED ORDER — SODIUM CHLORIDE 0.9% IV SOLUTION: Freq: Once | INTRAVENOUS | Status: AC

## 2020-05-03 MED ORDER — HYDRALAZINE HCL 20 MG/ML IJ SOLN: 10.0000 mg | INTRAMUSCULAR | Status: DC | PRN

## 2020-05-03 NOTE — Lactation Note (Addendum)
This note was copied from a baby's chart. Lactation Consultation Note  Patient Name: Carol Schmitt [id number] Date: 05/03/2020   Mom is overwhelmed today with having to get blood. Have been trying to assist mom with pillow support, positioning and keeping Carol Schmitt close for deep latch.  Mom lets Carol Schmitt slip off to the tip of the nipple where he gets a shallow latch and comes off falling asleep.  Once he realizes he is off the breast he roots back towards the breast trying to suck on his hands. Can easily hand express copious amounts of colostrum.  Demonstrated how to massage and sandwich breast pulling him in closer supporting the breast through the entire feeding.  Mom keeps him at the breast for 30 to 40 minutes, but only gets 15 to 20 minutes of productive sucking with him on and off the breast.  I can tell mom is exhausted and getting frustrated, but does not seem to want lactation's assistance. Mom starts asking for a bottle of formula for now stating she may want to breast feed later.  Explained supply and demand and asked if mom would be willing to pump for now to continue to stimulate the breast. Mom agreed.  [facility] set up in room with instructions in pumping, collection, storage, cleaning, labeling and handling expressed milk.  Mom did not like pumping at all stating it was just not for her.  She is leaving the option open to the possibility of putting her back to the breast at a later time.  Mom reports only wanting to breast feed for a little while anyway.  Hand out given and reviewed on normal newborn stomach size, normal course of lactation and routine newborn feeding patterns.  Lactation name and number written on white board and encouraged to call with any questions, concerns or assistance.   Maternal Data    Feeding Feeding Type: Bottle Fed - Formula Nipple Type: Slow - flow  LATCH Score                   Interventions    Lactation Tools Discussed/Used      Consult Status      Louis Meckel 05/03/2020, 7:30 PM

## 2020-05-03 NOTE — Progress Notes (Signed)
Pt has expressed interest in meds to beds and been counseled regarding her blood pressure medication.    [facility] has been provided with prescription billing information and is listed in Epic as primary pharmacy.    Please call main pharmacy when discharge orders are placed so that prescription can be picked up and taken to bedside.   Albina Billet, PharmD, BCPS Clinical Pharmacist 05/03/2020 3:16 PM'

## 2020-05-03 NOTE — Progress Notes (Signed)
Post Partum Day 1 Subjective: Doing well, no complaints.  Tolerating regular diet, pain with PO meds, voiding and ambulating without difficulty. Reports mild dull HA this am.   No CP SOB Fever,Chills, N/V or leg pain; denies nipple or breast pain no change of vision, RUQ/epigastric pain  Objective: BP (!) 145/89   Pulse 97   Temp 98.7 F (37.1 C) (Oral)   Resp 18   Ht 5\' 3"  (1.6 m)   Wt 73.7 kg   LMP 08/02/2019   SpO2 100%   Breastfeeding Unknown   BMI 28.76 kg/m     Intake/Output Summary (Last 24 hours) at 05/03/2020 1032 Last data filed at 05/03/2020 0422 Gross per 24 hour  Intake 1850.22 ml  Output 1870 ml  Net -19.78 ml     Physical Exam:  General: NAD Breasts: soft/nontender CV: RRR Pulm: nl effort, CTABL Abdomen: soft, NT, BS x 4 Perineum: minimal edema, lacerations hemostatic Lochia: moderate Uterine Fundus: fundus firm and 71fb below umbilicus DVT Evaluation: no cords, ttp LEs   Recent Labs    05/02/20 1411 05/03/20 0535  HGB 8.0* 6.8*  HCT 24.5* 19.3*  WBC 13.1* 8.4  PLT 130* 113*    Assessment/Plan: 19 y.o. G2P0 postpartum day # 1 S/p IOL for Pre-e  - Continue routine PP care - Lactation consult prn - Pre-eclampsia: now PPD1 with persistent mild range BPs. diuresing well. Added on CMP to AM blood draw. Procardia 60mg  XL ordered to start now. Will place meds to beds consult with pharmacy.  - chronic anemia with PP Hemorrhage, Hct 19.3 this am. Transfusion discussed and agreed on with pt, 1 unit PRBCs ordered; will complete 4hr post tx H/H    Disposition: Does not desire Dc home today.     05/05/20, CNM 05/03/2020  10:32 AM

## 2020-05-03 NOTE — Anesthesia Postprocedure Evaluation (Signed)
Anesthesia Post Note  Patient: Carol Schmitt  Procedure(s) Performed: AN AD HOC LABOR EPIDURAL  Patient location during evaluation: Mother Baby Anesthesia Type: Epidural Level of consciousness: awake and alert Pain management: pain level controlled Vital Signs Assessment: post-procedure vital signs reviewed and stable Respiratory status: spontaneous breathing, nonlabored ventilation and respiratory function stable Cardiovascular status: stable Postop Assessment: no headache, no backache and epidural receding Anesthetic complications: no   No complications documented.   Last Vitals:  Vitals:   05/03/20 0417 05/03/20 0818  BP: 134/89 (!) 153/109  Pulse: 84 95  Resp: 18   Temp: 36.8 C 37.1 C  SpO2: 100% 100%    Last Pain:  Vitals:   05/03/20 0818  TempSrc: Oral  PainSc:                  Rica Mast

## 2020-05-04 ENCOUNTER — Encounter: Payer: Self-pay | Admitting: Obstetrics and Gynecology

## 2020-05-04 LAB — TYPE AND SCREEN
ABO/RH(D): B POS
Antibody Screen: NEGATIVE
Unit division: 0

## 2020-05-04 LAB — CBC
HCT: 26.3 % — ABNORMAL LOW (ref 36.0–46.0)
Hemoglobin: 9.1 g/dL — ABNORMAL LOW (ref 12.0–15.0)
MCH: 29.1 pg (ref 26.0–34.0)
MCHC: 34.6 g/dL (ref 30.0–36.0)
MCV: 84 fL (ref 80.0–100.0)
Platelets: 135 10*3/uL — ABNORMAL LOW (ref 150–400)
RBC: 3.13 MIL/uL — ABNORMAL LOW (ref 3.87–5.11)
RDW: 13.8 % (ref 11.5–15.5)
WBC: 7 10*3/uL (ref 4.0–10.5)
nRBC: 0 % (ref 0.0–0.2)

## 2020-05-04 LAB — BPAM RBC
Blood Product Expiration Date: 202108122359
ISSUE DATE / TIME: 202107190953
Unit Type and Rh: 7300

## 2020-05-04 MED ORDER — NIFEDIPINE ER OSMOTIC RELEASE 30 MG PO TB24
90.0000 mg | ORAL_TABLET | Freq: Every day | ORAL | Status: DC
  Administered 2020-05-04: 90 mg via ORAL
  Filled 2020-05-04: qty 3

## 2020-05-04 MED ORDER — FERROUS SULFATE 325 (65 FE) MG PO TABS
325.0000 mg | ORAL_TABLET | Freq: Every day | ORAL | 1 refills | Status: DC
Start: 2020-05-04 — End: 2023-06-03

## 2020-05-04 MED ORDER — NIFEDIPINE ER OSMOTIC RELEASE 30 MG PO TB24
ORAL_TABLET | ORAL | 2 refills | Status: DC
Start: 2020-05-04 — End: 2021-09-14

## 2020-05-04 NOTE — Progress Notes (Signed)
Post Partum Day 2  Subjective: Doing well, no concerns. Ambulating without difficulty, pain managed with PO meds, tolerating regular diet, and voiding without difficulty.   No fever/chills, chest pain, shortness of breath, nausea/vomiting, or leg pain. No nipple or breast pain.   Objective: BP (!) 127/100 (BP Location: Left Arm) Comment: nurse Stanton Kidney notified  Pulse 95   Temp 98.6 F (37 C) (Oral)   Resp 18   Ht 5\' 3"  (1.6 m)   Wt 73.7 kg   LMP 08/02/2019   SpO2 100% Comment: Room Air  Breastfeeding Unknown   BMI 28.76 kg/m    Physical Exam:  General: alert, cooperative, appears stated age and no distress Breasts: soft/nontender CV: RRR Pulm: nl effort, CTABL Abdomen: soft, non-tender, active bowel sounds Uterine Fundus: firm Lochia: appropriate DVT Evaluation: No evidence of DVT seen on physical exam. No cords or calf tenderness. No significant calf/ankle edema.  Recent Labs    05/03/20 0535 05/03/20 0535 05/03/20 1714 05/04/20 0540  HGB 6.8*   < > 8.6* 9.1*  HCT 19.3*   < > 24.1* 26.3*  WBC 8.4  --   --  7.0  PLT 113*  --   --  135*   < > = values in this interval not displayed.    Assessment/Plan: 19 y.o. G2P1001 postpartum day # 2  -Continue routine postpartum care -Encouraged snug fitting bra, cold application, Tylenol PRN, and cabbage leaves for engorgement for formula feeding  -Acute blood loss anemia - hemodynamically stable and asymptomatic; continue PO ferrous sulfate BID with stool softeners  -Preeclampsia without severe features: blood pressures continue to be mild range on 60mg  nifedipine XL. Increase to 90mg  nifedipine XL daily.   Disposition: Desires discharge home today if blood pressure stabilizes   LOS: 3 days   26, CNM 05/04/2020, 8:15 AM   ----- Certified Nurse Midwife [facility]

## 2020-05-04 NOTE — Discharge Instructions (Signed)
Postpartum Care After Vaginal Delivery °This sheet gives you information about how to care for yourself from the time you deliver your baby to up to 6-12 weeks after delivery (postpartum period). Your health care provider may also give you more specific instructions. If you have problems or questions, contact your health care provider. °Follow these instructions at home: °Vaginal bleeding °· It is normal to have vaginal bleeding (lochia) after delivery. Wear a sanitary pad for vaginal bleeding and discharge. °? During the first week after delivery, the amount and appearance of lochia is often similar to a menstrual period. °? Over the next few weeks, it will gradually decrease to a dry, yellow-brown discharge. °? For most women, lochia stops completely by 4-6 weeks after delivery. Vaginal bleeding can vary from woman to woman. °· Change your sanitary pads frequently. Watch for any changes in your flow, such as: °? A sudden increase in volume. °? A change in color. °? Large blood clots. °· If you pass a blood clot from your vagina, save it and call your health care provider to discuss. Do not flush blood clots down the toilet before talking with your health care provider. °· Do not use tampons or douches until your health care provider says this is safe. °· If you are not breastfeeding, your period should return 6-8 weeks after delivery. If you are feeding your child breast milk only (exclusive breastfeeding), your period may not return until you stop breastfeeding. °Perineal care °· Keep the area between the vagina and the anus (perineum) clean and dry as told by your health care provider. Use medicated pads and pain-relieving sprays and creams as directed. °· If you had a cut in the perineum (episiotomy) or a tear in the vagina, check the area for signs of infection until you are healed. Check for: °? More redness, swelling, or pain. °? Fluid or blood coming from the cut or tear. °? Warmth. °? Pus or a bad  smell. °· You may be given a squirt bottle to use instead of wiping to clean the perineum area after you go to the bathroom. As you start healing, you may use the squirt bottle before wiping yourself. Make sure to wipe gently. °· To relieve pain caused by an episiotomy, a tear in the vagina, or swollen veins in the anus (hemorrhoids), try taking a warm sitz bath 2-3 times a day. A sitz bath is a warm water bath that is taken while you are sitting down. The water should only come up to your hips and should cover your buttocks. °Breast care °· Within the first few days after delivery, your breasts may feel heavy, full, and uncomfortable (breast engorgement). Milk may also leak from your breasts. Your health care provider can suggest ways to help relieve the discomfort. Breast engorgement should go away within a few days. °· If you are breastfeeding: °? Wear a bra that supports your breasts and fits you well. °? Keep your nipples clean and dry. Apply creams and ointments as told by your health care provider. °? You may need to use breast pads to absorb milk that leaks from your breasts. °? You may have uterine contractions every time you breastfeed for up to several weeks after delivery. Uterine contractions help your uterus return to its normal size. °? If you have any problems with breastfeeding, work with your health care provider or lactation consultant. °· If you are not breastfeeding: °? Avoid touching your breasts a lot. Doing this can make   your breasts produce more milk. °? Wear a good-fitting bra and use cold packs to help with swelling. °? Do not squeeze out (express) milk. This causes you to make more milk. °Intimacy and sexuality °· Ask your health care provider when you can engage in sexual activity. This may depend on: °? Your risk of infection. °? How fast you are healing. °? Your comfort and desire to engage in sexual activity. °· You are able to get pregnant after delivery, even if you have not had  your period. If desired, talk with your health care provider about methods of birth control (contraception). °Medicines °· Take over-the-counter and prescription medicines only as told by your health care provider. °· If you were prescribed an antibiotic medicine, take it as told by your health care provider. Do not stop taking the antibiotic even if you start to feel better. °Activity °· Gradually return to your normal activities as told by your health care provider. Ask your health care provider what activities are safe for you. °· Rest as much as possible. Try to rest or take a nap while your baby is sleeping. °Eating and drinking ° °· Drink enough fluid to keep your urine pale yellow. °· Eat high-fiber foods every day. These may help prevent or relieve constipation. High-fiber foods include: °? Whole grain cereals and breads. °? Brown rice. °? Beans. °? Fresh fruits and vegetables. °· Do not try to lose weight quickly by cutting back on calories. °· Take your prenatal vitamins until your postpartum checkup or until your health care provider tells you it is okay to stop. °Lifestyle °· Do not use any products that contain nicotine or tobacco, such as cigarettes and e-cigarettes. If you need help quitting, ask your health care provider. °· Do not drink alcohol, especially if you are breastfeeding. °General instructions °· Keep all follow-up visits for you and your baby as told by your health care provider. Most women visit their health care provider for a postpartum checkup within the first 3-6 weeks after delivery. °Contact a health care provider if: °· You feel unable to cope with the changes that your child brings to your life, and these feelings do not go away. °· You feel unusually sad or worried. °· Your breasts become red, painful, or hard. °· You have a fever. °· You have trouble holding urine or keeping urine from leaking. °· You have little or no interest in activities you used to enjoy. °· You have not  breastfed at all and you have not had a menstrual period for 12 weeks after delivery. °· You have stopped breastfeeding and you have not had a menstrual period for 12 weeks after you stopped breastfeeding. °· You have questions about caring for yourself or your baby. °· You pass a blood clot from your vagina. °Get help right away if: °· You have chest pain. °· You have difficulty breathing. °· You have sudden, severe leg pain. °· You have severe pain or cramping in your lower abdomen. °· You bleed from your vagina so much that you fill more than one sanitary pad in one hour. Bleeding should not be heavier than your heaviest period. °· You develop a severe headache. °· You faint. °· You have blurred vision or spots in your vision. °· You have bad-smelling vaginal discharge. °· You have thoughts about hurting yourself or your baby. °If you ever feel like you may hurt yourself or others, or have thoughts about taking your own life, get help   right away. You can go to the nearest emergency department or call: °· Your local emergency services (911 in the [place] °· A suicide crisis helpline, such as the National Suicide Prevention Lifeline at 1-800-273-8255. This is open 24 hours a day. °Summary °· The period of time right after you deliver your newborn up to 6-12 weeks after delivery is called the postpartum period. °· Gradually return to your normal activities as told by your health care provider. °· Keep all follow-up visits for you and your baby as told by your health care provider. °This information is not intended to replace advice given to you by your health care provider. Make sure you discuss any questions you have with your health care provider. °Document Revised: 10/05/2017 Document Reviewed: 07/16/2017 °Elsevier Patient Education © 2020 [employer] ° °Perinatal Depression °When a woman feels excessive sadness, anger, or anxiety during pregnancy or during the first 12 months after she gives birth, she has a  condition called perinatal depression. Depression can interfere with work, school, relationships, and other everyday activities. If it is not managed properly, it can also cause problems in the mother and her baby. °Sometimes, perinatal depression is left untreated because symptoms are thought to be normal mood swings during and right after pregnancy. If you have symptoms of depression, it is important to talk with your health care provider. °What are the causes? °The exact cause of this condition is not known. Hormonal changes during and after pregnancy may play a role in causing perinatal depression. °What increases the risk? °You are more likely to develop this condition if: °· You have a personal or family history of depression, anxiety, or mood disorders. °· You experience a stressful life event during pregnancy, such as the death of a loved one. °· You have a lot of regular life stress. °· You do not have support from family members or loved ones, or you are in an abusive relationship. °What are the signs or symptoms? °Symptoms of this condition include: °· Feeling sad or hopeless. °· Feelings of guilt. °· Feeling irritable or overwhelmed. °· Changes in your appetite. °· Lack of energy or motivation. °· Sleep problems. °· Difficulty concentrating or completing tasks. °· Loss of interest in hobbies or relationships. °· Headaches or stomach problems that do not go away. °How is this diagnosed? °This condition is diagnosed based on a physical exam and mental evaluation. In some cases, your health care provider may use a depression screening tool. These tools include a list of questions that can help a health care provider diagnose depression. Your health care provider may refer you to a mental health expert who specializes in depression. °How is this treated? °This condition may be treated with: °· Medicines. Your health care provider will only give you medicines that have been proven safe for pregnancy and  breastfeeding. °· Talk therapy with a mental health professional to help change your patterns of thinking (cognitive behavioral therapy). °· Support groups. °· Brain stimulation or light therapies. °· Stress reduction therapies, such as mindfulness. °Follow these instructions at home: °Lifestyle °· Do not use any products that contain nicotine or tobacco, such as cigarettes and e-cigarettes. If you need help quitting, ask your health care provider. °· Do not use alcohol when you are pregnant. After your baby is born, limit alcohol intake to no more than 1 drink a day. One drink equals 12 oz of beer, 5 oz of wine, or 1½ oz of hard liquor. °· Consider joining a   support group for new mothers. Ask your health care provider for recommendations. °· Take good care of yourself. Make sure you: °? Get plenty of sleep. If you are having trouble sleeping, talk with your health care provider. °? Eat a healthy diet. This includes plenty of fruits and vegetables, whole grains, and lean proteins. °? Exercise regularly, as told by your health care provider. Ask your health care provider what exercises are safe for you. °General instructions °· Take over-the-counter and prescription medicines only as told by your health care provider. °· Talk with your partner or family members about your feelings during pregnancy. Share any concerns or anxieties that you may have. °· Ask for help with tasks or chores when you need it. Ask friends and family members to provide meals, watch your children, or help with cleaning. °· Keep all follow-up visits as told by your health care provider. This is important. °Contact a health care provider if: °· You (or people close to you) notice that you have any symptoms of depression. °· You have depression and your symptoms get worse. °· You experience side effects from medicines, such as nausea or sleep problems. °Get help right away if: °· You feel like hurting yourself, your baby, or someone else. °If you  ever feel like you may hurt yourself or others, or have thoughts about taking your own life, get help right away. You can go to your nearest emergency department or call: °· Your local emergency services (911 in the [place] °· A suicide crisis helpline, such as the National Suicide Prevention Lifeline at 1-800-273-8255. This is open 24 hours a day. °Summary °· Perinatal depression is when a woman feels excessive sadness, anger, or anxiety during pregnancy or during the first 12 months after she gives birth. °· If perinatal depression is not treated, it can lead to health problems for the mother and her baby. °· This condition is treated with medicines, talk therapy, stress reduction therapies, or a combination of two or more treatments. °· Talk with your partner or family members about your feelings. Do not be afraid to ask for help. °This information is not intended to replace advice given to you by your health care provider. Make sure you discuss any questions you have with your health care provider. °Document Revised: 03/19/2019 Document Reviewed: 11/29/2016 °Elsevier Patient Education © 2020 [employer] Inc[employer] ° °

## 2020-05-04 NOTE — Progress Notes (Signed)
RN in room to do discharge teaching; all instructions given to pt; pt going home with significant other and new baby; nurse tech escorted pt and baby via wheelchair to medical mall

## 2020-05-13 ENCOUNTER — Other Ambulatory Visit: Payer: Self-pay

## 2020-05-13 ENCOUNTER — Emergency Department
Admission: EM | Admit: 2020-05-13 | Discharge: 2020-05-13 | Disposition: A | Discharge status: Home / Self Care | Payer: Medicaid Other | Attending: Emergency Medicine | Admitting: Emergency Medicine

## 2020-05-13 DIAGNOSIS — Z79899 Other long term (current) drug therapy: Secondary | ICD-10-CM | POA: Diagnosis not present

## 2020-05-13 DIAGNOSIS — R519 Headache, unspecified: Secondary | ICD-10-CM | POA: Insufficient documentation

## 2020-05-13 LAB — CBC WITH DIFFERENTIAL/PLATELET
Abs Immature Granulocytes: 0.01 10*3/uL (ref 0.00–0.07)
Basophils Absolute: 0.1 10*3/uL (ref 0.0–0.1)
Basophils Relative: 1 %
Eosinophils Absolute: 0.2 10*3/uL (ref 0.0–0.5)
Eosinophils Relative: 3 %
HCT: 35.7 % — ABNORMAL LOW (ref 36.0–46.0)
Hemoglobin: 11.6 g/dL — ABNORMAL LOW (ref 12.0–15.0)
Immature Granulocytes: 0 %
Lymphocytes Relative: 45 %
Lymphs Abs: 2.9 10*3/uL (ref 0.7–4.0)
MCH: 28.4 pg (ref 26.0–34.0)
MCHC: 32.5 g/dL (ref 30.0–36.0)
MCV: 87.3 fL (ref 80.0–100.0)
Monocytes Absolute: 0.3 10*3/uL (ref 0.1–1.0)
Monocytes Relative: 5 %
Neutro Abs: 3 10*3/uL (ref 1.7–7.7)
Neutrophils Relative %: 46 %
Platelets: 276 10*3/uL (ref 150–400)
RBC: 4.09 MIL/uL (ref 3.87–5.11)
RDW: 13.4 % (ref 11.5–15.5)
WBC: 6.4 10*3/uL (ref 4.0–10.5)
nRBC: 0 % (ref 0.0–0.2)

## 2020-05-13 LAB — COMPREHENSIVE METABOLIC PANEL
ALT: 14 U/L (ref 0–44)
AST: 18 U/L (ref 15–41)
Albumin: 3.7 g/dL (ref 3.5–5.0)
Alkaline Phosphatase: 83 U/L (ref 38–126)
Anion gap: 9 (ref 5–15)
BUN: 8 mg/dL (ref 6–20)
CO2: 24 mmol/L (ref 22–32)
Calcium: 9.3 mg/dL (ref 8.9–10.3)
Chloride: 105 mmol/L (ref 98–111)
Creatinine, Ser: 0.6 mg/dL (ref 0.44–1.00)
GFR calc Af Amer: >60 mL/min (ref >60)
GFR calc non Af Amer: >60 mL/min (ref >60)
Glucose, Bld: 87 mg/dL (ref 70–99)
Potassium: 3.7 mmol/L (ref 3.5–5.1)
Sodium: 138 mmol/L (ref 135–145)
Total Bilirubin: 0.7 mg/dL (ref 0.3–1.2)
Total Protein: 7.5 g/dL (ref 6.5–8.1)

## 2020-05-13 LAB — URINALYSIS, COMPLETE (UACMP) WITH MICROSCOPIC
Bacteria, UA: NONE SEEN
Bilirubin Urine: NEGATIVE
Glucose, UA: NEGATIVE mg/dL
Ketones, ur: NEGATIVE mg/dL
Nitrite: NEGATIVE
Protein, ur: NEGATIVE mg/dL
Specific Gravity, Urine: 1.016 (ref 1.005–1.030)
pH: 6 (ref 5.0–8.0)

## 2020-05-13 MED ORDER — DIPHENHYDRAMINE HCL 50 MG/ML IJ SOLN
50.0000 mg | Freq: Once | INTRAMUSCULAR | Status: AC
  Administered 2020-05-13: 50 mg via INTRAVENOUS
  Filled 2020-05-13: qty 1

## 2020-05-13 MED ORDER — DEXAMETHASONE SODIUM PHOSPHATE 10 MG/ML IJ SOLN
10.0000 mg | Freq: Once | INTRAMUSCULAR | Status: AC
  Administered 2020-05-13: 10 mg via INTRAVENOUS
  Filled 2020-05-13: qty 1

## 2020-05-13 MED ORDER — SODIUM CHLORIDE 0.9 % IV BOLUS
1000.0000 mL | Freq: Once | INTRAVENOUS | Status: AC
  Administered 2020-05-13: 1000 mL via INTRAVENOUS

## 2020-05-13 MED ORDER — METOCLOPRAMIDE HCL 5 MG/ML IJ SOLN
10.0000 mg | Freq: Once | INTRAMUSCULAR | Status: AC
  Administered 2020-05-13: 10 mg via INTRAVENOUS
  Filled 2020-05-13: qty 2

## 2020-05-13 NOTE — ED Triage Notes (Signed)
Pt arrives POV for reports of headache since last night. Pt reports she was induced on 07/17 due to high bp and gave birth vaginally on 07/18. Pt states she was prescribed nefidipine 30 mg and has been taking this regularly. Pt was told to get seen for a headache that would not go away. Pt speaking with this RN in NAD, speech clear, skin warm and dry.

## 2020-05-13 NOTE — ED Provider Notes (Signed)
Emergency Department Provider Note  ____________________________________________  Time seen: Approximately 10:54 PM  I have reviewed the triage vital signs and the nursing notes.   HISTORY  Chief Complaint Headache   Historian Patient    HPI Carol Schmitt is a 19 y.o. female presents to the emergency department with headache. Patient states that she experiences headache from time to time and her current headache feels similar. She reports that her headache came on slowly and states that pain primarily occurs across the forehead. Patient states that she has been sensitive to light but not to sound. She denies fever or chills at home. No rhinorrhea, nasal congestion or nonproductive cough. She denies dysuria, hematuria or increased urinary frequency. No low back pain. No falls or mechanisms of trauma. She states that she has not been staying hydrated at home and has experienced less sleep.   Past Medical History:  Diagnosis Date  . Medical history non-contributory      Immunizations up to date:  Yes.     Past Medical History:  Diagnosis Date  . Medical history non-contributory     Patient Active Problem List   Diagnosis Date Noted  . NSVD (normal spontaneous vaginal delivery) 05/02/2020  . Postpartum hemorrhage 05/02/2020  . Shoulder dystocia, delivered 05/02/2020  . Pre-eclampsia 05/01/2020  . Groin pain 03/24/2020    Past Surgical History:  Procedure Laterality Date  . NO PAST SURGERIES      Prior to Admission medications   Medication Sig Start Date End Date Taking? Authorizing Provider  ferrous sulfate 325 (65 FE) MG tablet Take 1 tablet (325 mg total) by mouth daily with breakfast. Take with Vitamin C 05/04/20 07/03/20  Christeen Douglas, MD  NIFEdipine (PROCARDIA-XL/NIFEDICAL-XL) 30 MG 24 hr tablet C 05/04/20   Christeen Douglas, MD  Prenatal Vit-Fe Fumarate-FA (MULTIVITAMIN-PRENATAL) 27-0.8 MG TABS tablet Take 1 tablet by mouth daily at 12 noon.    [provider]    Allergies Apple  History reviewed. No pertinent family history.  Social History Social History   Tobacco Use  . Smoking status: Never Smoker  . Smokeless tobacco: Never Used  Vaping Use  . Vaping Use: Never used  Substance Use Topics  . Alcohol use: Not Currently  . Drug use: Not Currently     Review of Systems  Constitutional: No fever/chills Eyes:  No discharge ENT: No upper respiratory complaints. Respiratory: no cough. No SOB/ use of accessory muscles to breath Gastrointestinal:   No nausea, no vomiting.  No diarrhea.  No constipation. Musculoskeletal: Negative for musculoskeletal pain. Neuro: Patient has headache.  Skin: Negative for rash, abrasions, lacerations, ecchymosis.    ____________________________________________   PHYSICAL EXAM:  VITAL SIGNS: ED Triage Vitals [05/13/20 1811]  Enc Vitals Group     BP (!) 116/92     Pulse Rate 89     Resp 18     Temp 98.9 F (37.2 C)     Temp Source Oral     SpO2 98 %     Weight 140 lb (63.5 kg)     Height 5\' 3"  (1.6 m)     Head Circumference      Peak Flow      Pain Score 6     Pain Loc      Pain Edu?      Excl. in GC?      Constitutional: Alert and oriented. Well appearing and in no acute distress. Eyes: Conjunctivae are normal. PERRL. EOMI. Head: Atraumatic. ENT:  Ears: TMs are pearly.       Nose: No congestion/rhinnorhea.      Mouth/Throat: Mucous membranes are moist.  Neck: No stridor.  FROM  Cardiovascular: Normal rate, regular rhythm. Normal S1 and S2.  Good peripheral circulation. Respiratory: Normal respiratory effort without tachypnea or retractions. Lungs CTAB. Good air entry to the bases with no decreased or absent breath sounds Gastrointestinal: Bowel sounds x 4 quadrants. Soft and nontender to palpation. No guarding or rigidity. No distention. Musculoskeletal: Full range of motion to all extremities. No obvious deformities noted Neurologic:  Normal for age. No  gross focal neurologic deficits are appreciated.  Skin:  Skin is warm, dry and intact. No rash noted. Psychiatric: Mood and affect are normal for age. Speech and behavior are normal.   ____________________________________________   LABS (all labs ordered are listed, but only abnormal results are displayed)  Labs Reviewed  CBC WITH DIFFERENTIAL/PLATELET - Abnormal; Notable for the following components:      Result Value   Hemoglobin 11.6 (*)    HCT 35.7 (*)    All other components within normal limits  URINALYSIS, COMPLETE (UACMP) WITH MICROSCOPIC - Abnormal; Notable for the following components:   Color, Urine YELLOW (*)    APPearance HAZY (*)    Hgb urine dipstick SMALL (*)    Leukocytes,Ua LARGE (*)    All other components within normal limits  COMPREHENSIVE METABOLIC PANEL   ____________________________________________  EKG   ____________________________________________  RADIOLOGY   No results found.  ____________________________________________    PROCEDURES  Procedure(s) performed:     Procedures     Medications  metoCLOPramide (REGLAN) injection 10 mg (10 mg Intravenous Given 05/13/20 2209)  diphenhydrAMINE (BENADRYL) injection 50 mg (50 mg Intravenous Given 05/13/20 2209)  dexamethasone (DECADRON) injection 10 mg (10 mg Intravenous Given 05/13/20 2210)  sodium chloride 0.9 % bolus 1,000 mL (0 mLs Intravenous Stopped 05/13/20 2236)     ____________________________________________   INITIAL IMPRESSION / ASSESSMENT AND PLAN / ED COURSE  Pertinent labs & imaging results that were available during my care of the patient were reviewed by me and considered in my medical decision making (see chart for details).      Assessment and plan Headache 19 year old female presents to the emergency department with acute frontal headache that has occurred over the past 2 days.  Vital signs were reassuring at triage. CBC and CMP were reassuring. There was a large  amount of leukocytes on urinalysis the patient denies signs and symptoms of a UTI at this time.  Patient was given a migraine cocktail consisting of Reglan, Decadron, Benadryl and fluids. She reported that her headache completely resolved and she requested to go home.  Advised increase hydration at home. Return precautions were given to return with new or worsening symptoms.    ____________________________________________  FINAL CLINICAL IMPRESSION(S) / ED DIAGNOSES  Final diagnoses:  Acute nonintractable headache, unspecified headache type      NEW MEDICATIONS STARTED DURING THIS VISIT:  ED Discharge Orders    None          This chart was dictated using voice recognition software/Dragon. Despite best efforts to proofread, errors can occur which can change the meaning. Any change was purely unintentional.     Gasper Lloyd 05/13/20 2259    Chesley Noon, MD 05/13/20 [phone]

## 2020-05-13 NOTE — ED Notes (Signed)
Refer to triage noted: Pt states she was prescribed nefidipine 30 mg and has been taking this regularly. Pt was told be seen for a HA that would not go away. Pt st HA has gotten progressively worse.  St taking 325mg  tylenol at home OTC w/o relief.  No visual changes. Pt speaking in clear sentences.

## 2020-10-16 NOTE — L&D Delivery Note (Addendum)
H Delivery Note  Carol Schmitt is a G2P1001 at [redacted]w[redacted]d with an LMP unknown, dated by  [place] at [redacted]w[redacted]d with EDD 11/13/21  First Stage: Labor onset: 0400 Analgesia /Anesthesia intrapartum: Epidural SROM at 0400  Second Stage: Complete dilation at 1549 Onset of pushing at 1602 FHR second stage  120 bpm Category 2 with variables, moderate variability  Delivery of a viable baby boy on 09/19/2021  at 1604 by Chari Manning CNM Delivery of fetal head in OA position with restitution to LOT. no nuchal cord;  Anterior then posterior shoulders delivered easily with gentle downward traction. Baby placed on mom's chest, and attended to by baby RN Cord double clamped after cessation of pulsation, cut by FOB  Cord blood sample collection: Not Indicated B POS Collection of cord blood donation N/A Arterial cord blood sample N/A  Third Stage: Oxytocin bolus started after delivery of infant for hemorrhage prophylaxis  Placenta delivered spontaneously intact with 3 VC @ 1608 Placenta disposition: discarded Uterine tone firm / bleeding scant  Left periurethral abrasion identified  Anesthesia for repair: N/A Repair none Est. Blood Loss (mL): 25  Complications: Preterm labor  Mom to postpartum.  Baby to NICU.  Newborn: Information for the patient's newborn:  [name], [name] [[id number]]  Live born female  Birth Weight: 4 lb 3.4 oz (1910 g) APGAR: ,   Newborn Delivery   Birth date/time: 09/19/2021 16:04:00 Delivery type: Vaginal, Spontaneous        Feeding planned: TBD  ---------- Chari Manning, CNM Certified Nurse Midwife [facility]

## 2021-03-31 ENCOUNTER — Other Ambulatory Visit: Payer: Self-pay

## 2021-03-31 ENCOUNTER — Emergency Department
Admission: EM | Admit: 2021-03-31 | Discharge: 2021-03-31 | Disposition: A | Discharge status: Home / Self Care | Payer: Managed Care, Other (non HMO) | Attending: Emergency Medicine | Admitting: Emergency Medicine

## 2021-03-31 ENCOUNTER — Emergency Department: Payer: Managed Care, Other (non HMO)

## 2021-03-31 DIAGNOSIS — R079 Chest pain, unspecified: Secondary | ICD-10-CM | POA: Insufficient documentation

## 2021-03-31 LAB — BASIC METABOLIC PANEL
Anion gap: 6 (ref 5–15)
BUN: 7 mg/dL (ref 6–20)
CO2: 23 mmol/L (ref 22–32)
Calcium: 8.8 mg/dL — ABNORMAL LOW (ref 8.9–10.3)
Chloride: 105 mmol/L (ref 98–111)
Creatinine, Ser: 0.47 mg/dL (ref 0.44–1.00)
GFR, Estimated: >60 mL/min (ref >60)
Glucose, Bld: 98 mg/dL (ref 70–99)
Potassium: 3.7 mmol/L (ref 3.5–5.1)
Sodium: 134 mmol/L — ABNORMAL LOW (ref 135–145)

## 2021-03-31 LAB — CBC
HCT: 33.6 % — ABNORMAL LOW (ref 36.0–46.0)
Hemoglobin: 12 g/dL (ref 12.0–15.0)
MCH: 30.7 pg (ref 26.0–34.0)
MCHC: 35.7 g/dL (ref 30.0–36.0)
MCV: 85.9 fL (ref 80.0–100.0)
Platelets: 182 10*3/uL (ref 150–400)
RBC: 3.91 MIL/uL (ref 3.87–5.11)
RDW: 12.1 % (ref 11.5–15.5)
WBC: 7 10*3/uL (ref 4.0–10.5)
nRBC: 0 % (ref 0.0–0.2)

## 2021-03-31 LAB — TROPONIN I (HIGH SENSITIVITY): Troponin I (High Sensitivity): <2 ng/L (ref <18)

## 2021-03-31 NOTE — ED Provider Notes (Signed)
[facility] Emergency Department Provider Note  ____________________________________________   Event Date/Time   First MD Initiated Contact with Patient 03/31/21 1829     (approximate)  I have reviewed the triage vital signs and the nursing notes.   HISTORY  Chief Complaint Chest Pain   HPI Carol Schmitt is a 20 y.o. female without significant past medical history apart from being approximately 10 months postpartum currently with birth control of Depo-Provera shots who presents for assessment approximately 2 weeks of some intermittent bilateral chest achiness.  She states it is sometimes worse after lifting her baby she also sometimes sleeps on her.  She states is not currently in pain and is not clearly exacerbated by twisting or movement of her arms.  She denies any shortness of breath, cough, back pain, headache area, sore throat, abdominal pain, vomiting, diarrhea, dysuria, rash or any pain in her extremities.  No history of DVT/PE.  Denies EtOH use or any other tobacco abuse.  No other acute concerns at this time.  She has not taken any medications for symptoms         Past Medical History:  Diagnosis Date   Medical history non-contributory     Patient Active Problem List   Diagnosis Date Noted   NSVD (normal spontaneous vaginal delivery) 05/02/2020   Postpartum hemorrhage 05/02/2020   Shoulder dystocia, delivered 05/02/2020   Pre-eclampsia 05/01/2020   Groin pain 03/24/2020    Past Surgical History:  Procedure Laterality Date   NO PAST SURGERIES      Prior to Admission medications   Medication Sig Start Date End Date Taking? Authorizing Provider  ferrous sulfate 325 (65 FE) MG tablet Take 1 tablet (325 mg total) by mouth daily with breakfast. Take with Vitamin C 05/04/20 07/03/20  Christeen Douglas, MD  NIFEdipine (PROCARDIA-XL/NIFEDICAL-XL) 30 MG 24 hr tablet C 05/04/20   Christeen Douglas, MD  Prenatal Vit-Fe Fumarate-FA  (MULTIVITAMIN-PRENATAL) 27-0.8 MG TABS tablet Take 1 tablet by mouth daily at 12 noon.    [provider]    Allergies Apple  No family history on file.  Social History Social History   Tobacco Use   Smoking status: Never   Smokeless tobacco: Never  Vaping Use   Vaping Use: Never used  Substance Use Topics   Alcohol use: Not Currently   Drug use: Not Currently    Types: Marijuana    Review of Systems  Review of Systems  Constitutional:  Negative for chills and fever.  HENT:  Negative for sore throat.   Eyes:  Negative for pain.  Respiratory:  Negative for cough and stridor.   Cardiovascular:  Positive for chest pain.  Gastrointestinal:  Negative for vomiting.  Genitourinary:  Negative for dysuria.  Musculoskeletal:  Negative for myalgias.  Skin:  Negative for rash.  Neurological:  Negative for seizures, loss of consciousness and headaches.  Psychiatric/Behavioral:  Negative for suicidal ideas.   All other systems reviewed and are negative.    ____________________________________________   PHYSICAL EXAM:  VITAL SIGNS: ED Triage Vitals  Enc Vitals Group     BP 03/31/21 1816 116/86     Pulse Rate 03/31/21 1816 79     Resp 03/31/21 1816 18     Temp 03/31/21 1816 98.9 F (37.2 C)     Temp Source 03/31/21 1816 Oral     SpO2 03/31/21 1816 99 %     Weight 03/31/21 1817 130 lb (59 kg)     Height 03/31/21 1817  5\' 5"  (1.651 m)     Head Circumference --      Peak Flow --      Pain Score 03/31/21 1817 2     Pain Loc --      Pain Edu? --      Excl. in GC? --    Vitals:   03/31/21 1816  BP: 116/86  Pulse: 79  Resp: 18  Temp: 98.9 F (37.2 C)  SpO2: 99%   Physical Exam Vitals and nursing note reviewed.  Constitutional:      General: She is not in acute distress.    Appearance: She is well-developed.  HENT:     Head: Normocephalic and atraumatic.     Right Ear: External ear normal.     Left Ear: External ear normal.     Nose: Nose normal.   Eyes:     Conjunctiva/sclera: Conjunctivae normal.  Cardiovascular:     Rate and Rhythm: Normal rate and regular rhythm.     Heart sounds: No murmur heard. Pulmonary:     Effort: Pulmonary effort is normal. No respiratory distress.     Breath sounds: Normal breath sounds.  Abdominal:     Palpations: Abdomen is soft.     Tenderness: There is no abdominal tenderness.  Musculoskeletal:     Cervical back: Neck supple.  Skin:    General: Skin is warm and dry.     Capillary Refill: Capillary refill takes less than 2 seconds.  Neurological:     Mental Status: She is alert and oriented to person, place, and time.  Psychiatric:        Mood and Affect: Mood normal.     ____________________________________________   LABS (all labs ordered are listed, but only abnormal results are displayed)  Labs Reviewed  BASIC METABOLIC PANEL - Abnormal; Notable for the following components:      Result Value   Sodium 134 (*)    Calcium 8.8 (*)    All other components within normal limits  CBC - Abnormal; Notable for the following components:   HCT 33.6 (*)    All other components within normal limits  POC URINE PREG, ED  TROPONIN I (HIGH SENSITIVITY)   ____________________________________________  EKG  Sinus rhythm with a ventricular rate of 86, normal axis, unremarkable intervals without clearance of acute ischemia or significant underlying arrhythmia. ____________________________________________  RADIOLOGY  ED MD interpretation: No focal consolidation, large effusion, significant edema, pneumothorax or any other clear acute intrathoracic process.  Official radiology report(s): DG Chest 2 View  Result Date: 03/31/2021 CLINICAL DATA:  Generalized chest pain for couple of weeks. EXAM: CHEST - 2 VIEW COMPARISON:  None. FINDINGS: Heart size and pulmonary vascularity are normal. Lungs are clear. No pleural effusions. No pneumothorax. Mediastinal contours appear intact. Thoracolumbar  scoliosis convex towards the left. IMPRESSION: No active cardiopulmonary disease. Electronically Signed   By: 04/02/2021 M.D.   On: 03/31/2021 18:55    ____________________________________________   PROCEDURES  Procedure(s) performed (including Critical Care):  Procedures   ____________________________________________   INITIAL IMPRESSION / ASSESSMENT AND PLAN / ED COURSE      Patient presents with above-stated history exam for assessment of some intermittent chest pain sometimes in the left and sometimes on the right over the last 2 weeks.  She states she Does not currently have significant pain but sometimes it is worse with movement and sometimes not.  No other clear associated symptoms.  She does note she has been lifting her baby  quite a bit and her he also sometimes sleeps on her chest.  She is afebrile and hemodynamically stable on arrival.  Differential includes pneumonia, PE, ACS, pericarditis, myocarditis, pleurisy, costochondritis and MSK.  Overall suspicion for PE as patient is PERC negative and she is not on any estrogen therapy only on progesterone.  ECG and nonelevated troponin are not suggestive of ACS or myocarditis.  No significant arrhythmia identified on ECG.  Chest x-ray has no evidence of pneumonia, pneumothorax, effusion, edema or other clear acute intrathoracic process.  BMP shows no significant electrolyte or metabolic derangements.  CBC is unremarkable.  Overall unclear allergy for patient symptoms although costochondritis versus other MSK are all within differential.  Patient declined analgesia in the emergency room.  She was discharged stable condition.  Strict return precautions advised and discussed.      ____________________________________________   FINAL CLINICAL IMPRESSION(S) / ED DIAGNOSES  Final diagnoses:  Chest pain, unspecified type    Medications - No data to display   ED Discharge Orders     None        Note:  This  document was prepared using Dragon voice recognition software and may include unintentional dictation errors.    Gilles Chiquito, MD 03/31/21 1910

## 2021-03-31 NOTE — ED Triage Notes (Signed)
Pt to ED for generalized cp x2 weeks. Denies shob, N/V Pt in NAD States pain worsens with movement

## 2021-03-31 NOTE — ED Notes (Signed)
Patient transported to X-ray 

## 2021-05-20 ENCOUNTER — Emergency Department
Admission: EM | Admit: 2021-05-20 | Discharge: 2021-05-20 | Disposition: A | Discharge status: Home / Self Care | Payer: Managed Care, Other (non HMO) | Attending: Emergency Medicine | Admitting: Emergency Medicine

## 2021-05-20 ENCOUNTER — Other Ambulatory Visit: Payer: Self-pay

## 2021-05-20 DIAGNOSIS — T7840XA Allergy, unspecified, initial encounter: Secondary | ICD-10-CM | POA: Insufficient documentation

## 2021-05-20 DIAGNOSIS — R220 Localized swelling, mass and lump, head: Secondary | ICD-10-CM | POA: Diagnosis present

## 2021-05-20 LAB — BASIC METABOLIC PANEL
Anion gap: 7 (ref 5–15)
BUN: 7 mg/dL (ref 6–20)
CO2: 21 mmol/L — ABNORMAL LOW (ref 22–32)
Calcium: 9 mg/dL (ref 8.9–10.3)
Chloride: 105 mmol/L (ref 98–111)
Creatinine, Ser: 0.4 mg/dL — ABNORMAL LOW (ref 0.44–1.00)
GFR, Estimated: >60 mL/min (ref >60)
Glucose, Bld: 93 mg/dL (ref 70–99)
Potassium: 3.4 mmol/L — ABNORMAL LOW (ref 3.5–5.1)
Sodium: 133 mmol/L — ABNORMAL LOW (ref 135–145)

## 2021-05-20 LAB — CBC WITH DIFFERENTIAL/PLATELET
Abs Immature Granulocytes: 0.02 10*3/uL (ref 0.00–0.07)
Basophils Absolute: 0 10*3/uL (ref 0.0–0.1)
Basophils Relative: 0 %
Eosinophils Absolute: 0.1 10*3/uL (ref 0.0–0.5)
Eosinophils Relative: 1 %
HCT: 30.7 % — ABNORMAL LOW (ref 36.0–46.0)
Hemoglobin: 11.1 g/dL — ABNORMAL LOW (ref 12.0–15.0)
Immature Granulocytes: 0 %
Lymphocytes Relative: 24 %
Lymphs Abs: 1.9 10*3/uL (ref 0.7–4.0)
MCH: 31.5 pg (ref 26.0–34.0)
MCHC: 36.2 g/dL — ABNORMAL HIGH (ref 30.0–36.0)
MCV: 87.2 fL (ref 80.0–100.0)
Monocytes Absolute: 0.3 10*3/uL (ref 0.1–1.0)
Monocytes Relative: 4 %
Neutro Abs: 5.5 10*3/uL (ref 1.7–7.7)
Neutrophils Relative %: 71 %
Platelets: 188 10*3/uL (ref 150–400)
RBC: 3.52 MIL/uL — ABNORMAL LOW (ref 3.87–5.11)
RDW: 12 % (ref 11.5–15.5)
WBC: 7.9 10*3/uL (ref 4.0–10.5)
nRBC: 0 % (ref 0.0–0.2)

## 2021-05-20 MED ORDER — EPINEPHRINE 0.3 MG/0.3ML IJ SOAJ: 0.3000 mg | INTRAMUSCULAR | 1 refills | Status: AC | PRN

## 2021-05-20 NOTE — ED Provider Notes (Signed)
[facility] Emergency Department Provider Note   ____________________________________________   Event Date/Time   First MD Initiated Contact with Patient 05/20/21 1949     (approximate)  I have reviewed the triage vital signs and the nursing notes.   HISTORY  Chief Complaint Allergic Reaction    HPI Carol Schmitt is a 20 y.o. 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 Authorizing Provider  EPINEPHrine (EPIPEN 2-PAK) 0.3 mg/0.3 mL IJ SOAJ injection Inject 0.3 mg into the muscle as needed for anaphylaxis. 05/20/21  Yes Merwyn Katos, MD  ferrous sulfate 325 (65 FE) MG tablet Take 1 tablet (325 mg total) by mouth daily with breakfast. Take with Vitamin C 05/04/20 07/03/20  Christeen Douglas, MD  NIFEdipine (PROCARDIA-XL/NIFEDICAL-XL) 30 MG 24 hr tablet C 05/04/20   Christeen Douglas, MD  Prenatal Vit-Fe Fumarate-FA (MULTIVITAMIN-PRENATAL) 27-0.8 MG  TABS tablet Take 1 tablet by mouth daily at 12 noon.    [provider]    Allergies Apple  No family history on file.  Social History Social History   Tobacco Use   Smoking status: Never   Smokeless tobacco: Never  Vaping Use   Vaping Use: Never used  Substance Use Topics   Alcohol use: Not Currently   Drug use: Not Currently    Types: Marijuana    Review of Systems Constitutional: No fever/chills Eyes: No visual changes. ENT: No sore throat. Cardiovascular: Denies chest pain. Respiratory: Denies shortness of breath. Gastrointestinal: No abdominal pain.  Endorses nausea and vomiting.  No diarrhea. Genitourinary: Negative for dysuria. Musculoskeletal: Negative for acute arthralgias Skin: Endorses erythema and swelling of the face Neurological: Negative for headaches, weakness/numbness/paresthesias in any extremity Psychiatric: Negative for suicidal ideation/homicidal ideation   ____________________________________________   PHYSICAL EXAM:  VITAL SIGNS: ED Triage Vitals  Enc Vitals Group     BP 05/20/21 1629 114/68     Pulse Rate 05/20/21 1629 73     Resp 05/20/21 1629 16     Temp 05/20/21 1629 98.6 F (37 C)     Temp Source 05/20/21 1629 Oral     SpO2 05/20/21 1629 99 %     Weight 05/20/21 1627 132 lb 4.4 oz (60 kg)     Height 05/20/21 1627 5\' 5"  (1.651 m)     Head Circumference --      Peak Flow --      Pain Score 05/20/21 1626 0     Pain Loc --  Pain Edu? --      Excl. in GC? --    Constitutional: Alert and oriented. Well appearing and in no acute distress. Eyes: Conjunctivae are injected. PERRL. Head: Atraumatic. Nose: No congestion/rhinnorhea. Mouth/Throat: Mucous membranes are moist. Neck: No stridor Cardiovascular: Grossly normal heart sounds.  Good peripheral circulation. Respiratory: Normal respiratory effort.  No retractions. Gastrointestinal: Soft and nontender. No distention. Musculoskeletal: No obvious  deformities Neurologic:  Normal speech and language. No gross focal neurologic deficits are appreciated. Skin:  Skin is warm and dry. No rash noted.  Facial erythema Psychiatric: Mood and affect are normal. Speech and behavior are normal.  ____________________________________________   LABS (all labs ordered are listed, but only abnormal results are displayed)  Labs Reviewed  CBC WITH DIFFERENTIAL/PLATELET - Abnormal; Notable for the following components:      Result Value   RBC 3.52 (*)    Hemoglobin 11.1 (*)    HCT 30.7 (*)    MCHC 36.2 (*)    All other components within normal limits  BASIC METABOLIC PANEL - Abnormal; Notable for the following components:   Sodium 133 (*)    Potassium 3.4 (*)    CO2 21 (*)    Creatinine, Ser 0.40 (*)    All other components within normal limits   PROCEDURES  Procedure(s) performed (including Critical Care):  .1-3 Lead EKG Interpretation  Date/Time: 05/20/2021 8:00 PM Performed by: Merwyn Katos, MD Authorized by: Merwyn Katos, MD     Interpretation: normal     ECG rate:  72   ECG rate assessment: normal     Rhythm: sinus rhythm     Ectopy: none     Conduction: normal     ____________________________________________   INITIAL IMPRESSION / ASSESSMENT AND PLAN / ED COURSE  As part of my medical decision making, I reviewed the following data within the electronic medical record, if available:  Nursing notes reviewed and incorporated, Labs reviewed, EKG interpreted, Old chart reviewed, Radiograph reviewed and Notes from prior ED visits reviewed and incorporated        + Patient hives/erythema No signs of airway compromise  Given history and exam, presentation most consistent with allergic reaction. I have low suspicion for toxic shock syndrome, anaphylaxis, asthma exacerbation, or drug toxicity. Rx: Benadryl 25mg  q8hr x3days, will hold prednisone dosing given current pregnancy Disposition: Discharge home with SRP. Follow up  with PCP in 1-2 days.      ____________________________________________   FINAL CLINICAL IMPRESSION(S) / ED DIAGNOSES  Final diagnoses:  Allergic reaction, initial encounter     ED Discharge Orders          Ordered    EPINEPHrine (EPIPEN 2-PAK) 0.3 mg/0.3 mL IJ SOAJ injection  As needed        05/20/21 1956             Note:  This document was prepared using Dragon voice recognition software and may include unintentional dictation errors.    07/20/21, MD 05/20/21 07/20/21

## 2021-05-20 NOTE — ED Triage Notes (Signed)
Patient reports eating Carol Schmitt around lunch time, and reports facial swelling and vomiting right after eating the meal. Patient reports she is 4 months pregnant, and was sent here for evaluation by OB for allergic reaction. Patient reports improvement to facial swelling, but reports dry mouth at this time. Patient denies SOB.

## 2021-05-23 DIAGNOSIS — Z3483 Encounter for supervision of other normal pregnancy, third trimester: Secondary | ICD-10-CM | POA: Insufficient documentation

## 2021-05-31 ENCOUNTER — Encounter: Payer: Self-pay | Admitting: Intensive Care

## 2021-05-31 ENCOUNTER — Other Ambulatory Visit: Payer: Self-pay

## 2021-05-31 ENCOUNTER — Emergency Department
Admission: EM | Admit: 2021-05-31 | Discharge: 2021-05-31 | Disposition: A | Discharge status: Home / Self Care | Payer: Managed Care, Other (non HMO) | Attending: Emergency Medicine | Admitting: Emergency Medicine

## 2021-05-31 DIAGNOSIS — R0789 Other chest pain: Secondary | ICD-10-CM | POA: Diagnosis not present

## 2021-05-31 DIAGNOSIS — O26892 Other specified pregnancy related conditions, second trimester: Secondary | ICD-10-CM | POA: Insufficient documentation

## 2021-05-31 DIAGNOSIS — Z3A16 16 weeks gestation of pregnancy: Secondary | ICD-10-CM | POA: Diagnosis not present

## 2021-05-31 DIAGNOSIS — N898 Other specified noninflammatory disorders of vagina: Secondary | ICD-10-CM | POA: Insufficient documentation

## 2021-05-31 LAB — BASIC METABOLIC PANEL
Anion gap: 8 (ref 5–15)
BUN: 6 mg/dL (ref 6–20)
CO2: 21 mmol/L — ABNORMAL LOW (ref 22–32)
Calcium: 8.9 mg/dL (ref 8.9–10.3)
Chloride: 104 mmol/L (ref 98–111)
Creatinine, Ser: 0.51 mg/dL (ref 0.44–1.00)
GFR, Estimated: >60 mL/min (ref >60)
Glucose, Bld: 123 mg/dL — ABNORMAL HIGH (ref 70–99)
Potassium: 3.6 mmol/L (ref 3.5–5.1)
Sodium: 133 mmol/L — ABNORMAL LOW (ref 135–145)

## 2021-05-31 LAB — CBC
HCT: 30 % — ABNORMAL LOW (ref 36.0–46.0)
Hemoglobin: 10.8 g/dL — ABNORMAL LOW (ref 12.0–15.0)
MCH: 31.8 pg (ref 26.0–34.0)
MCHC: 36 g/dL (ref 30.0–36.0)
MCV: 88.2 fL (ref 80.0–100.0)
Platelets: 176 10*3/uL (ref 150–400)
RBC: 3.4 MIL/uL — ABNORMAL LOW (ref 3.87–5.11)
RDW: 12.2 % (ref 11.5–15.5)
WBC: 7.5 10*3/uL (ref 4.0–10.5)
nRBC: 0 % (ref 0.0–0.2)

## 2021-05-31 LAB — TROPONIN I (HIGH SENSITIVITY): Troponin I (High Sensitivity): <2 ng/L

## 2021-05-31 LAB — HCG, QUANTITATIVE, PREGNANCY: hCG, Beta Chain, Quant, S: 45859 m[IU]/mL — ABNORMAL HIGH

## 2021-05-31 NOTE — ED Provider Notes (Signed)
[facility] Emergency Department Provider Note   ____________________________________________   Event Date/Time   First MD Initiated Contact with Patient 05/31/21 1839     (approximate)  I have reviewed the triage vital signs and the nursing notes.   HISTORY  Chief Complaint Chest Pain    HPI Carol Schmitt is a 20 y.o. female who presents for left upper chest wall pain   LOCATION: Left upper chest DURATION: 2 weeks prior to arrival TIMING: Intermittent but stable since onset and severity SEVERITY: 1/10 QUALITY: Sharp CONTEXT: Patient states that this pain started a few years ago but got better after rest and Tylenol however it began again recently and usually occurs when she is laying flat or taking a deep breath MODIFYING FACTORS: Deep breathing worsens this pain and is partially relieved with standing up or walking ASSOCIATED SYMPTOMS: Denies   Per medical record review, patient is [redacted] weeks pregnant with a history of preeclampsia          Past Medical History:  Diagnosis Date   Medical history non-contributory     Patient Active Problem List   Diagnosis Date Noted   NSVD (normal spontaneous vaginal delivery) 05/02/2020   Postpartum hemorrhage 05/02/2020   Shoulder dystocia, delivered 05/02/2020   Pre-eclampsia 05/01/2020   Groin pain 03/24/2020    Past Surgical History:  Procedure Laterality Date   NO PAST SURGERIES      Prior to Admission medications   Medication Sig Start Date End Date Taking? Authorizing Provider  EPINEPHrine (EPIPEN 2-PAK) 0.3 mg/0.3 mL IJ SOAJ injection Inject 0.3 mg into the muscle as needed for anaphylaxis. 05/20/21   Merwyn Katos, MD  ferrous sulfate 325 (65 FE) MG tablet Take 1 tablet (325 mg total) by mouth daily with breakfast. Take with Vitamin C 05/04/20 07/03/20  Christeen Douglas, MD  NIFEdipine (PROCARDIA-XL/NIFEDICAL-XL) 30 MG 24 hr tablet C 05/04/20   Christeen Douglas, MD  Prenatal Vit-Fe  Fumarate-FA (MULTIVITAMIN-PRENATAL) 27-0.8 MG TABS tablet Take 1 tablet by mouth daily at 12 noon.    [provider]    Allergies Apple  History reviewed. No pertinent family history.  Social History Social History   Tobacco Use   Smoking status: Never   Smokeless tobacco: Never  Vaping Use   Vaping Use: Never used  Substance Use Topics   Alcohol use: Not Currently   Drug use: Not Currently    Types: Marijuana    Review of Systems Constitutional: No fever/chills Eyes: No visual changes. ENT: No sore throat. Cardiovascular: Endorses chest pain. Respiratory: Denies shortness of breath. Gastrointestinal: No abdominal pain.  No nausea, no vomiting.  No diarrhea. Genitourinary: Negative for dysuria. Musculoskeletal: Negative for acute arthralgias Skin: Negative for rash. Neurological: Negative for headaches, weakness/numbness/paresthesias in any extremity Psychiatric: Negative for suicidal ideation/homicidal ideation   ____________________________________________   PHYSICAL EXAM:  VITAL SIGNS: ED Triage Vitals  Enc Vitals Group     BP 05/31/21 1802 (!) 112/57     Pulse Rate 05/31/21 1802 86     Resp 05/31/21 1802 17     Temp 05/31/21 1802 98.7 F (37.1 C)     Temp Source 05/31/21 1802 Oral     SpO2 05/31/21 1802 100 %     Weight 05/31/21 1813 131 lb (59.4 kg)     Height 05/31/21 1813 5\' 3"  (1.6 m)     Head Circumference --      Peak Flow --      Pain Score  05/31/21 1813 1     Pain Loc --      Pain Edu? --      Excl. in GC? --    Constitutional: Alert and oriented. Well appearing and in no acute distress. Eyes: Conjunctivae are normal. PERRL. Head: Atraumatic. Nose: No congestion/rhinnorhea. Mouth/Throat: Mucous membranes are moist. Neck: No stridor Cardiovascular: Grossly normal heart sounds.  Good peripheral circulation. Respiratory: Normal respiratory effort.  No retractions. Gastrointestinal: Soft and nontender. No  distention. Musculoskeletal: No obvious deformities Neurologic:  Normal speech and language. No gross focal neurologic deficits are appreciated. Skin:  Skin is warm and dry. No rash noted. Psychiatric: Mood and affect are normal. Speech and behavior are normal.  ____________________________________________   LABS (all labs ordered are listed, but only abnormal results are displayed)  Labs Reviewed  BASIC METABOLIC PANEL - Abnormal; Notable for the following components:      Result Value   Sodium 133 (*)    CO2 21 (*)    Glucose, Bld 123 (*)    All other components within normal limits  CBC - Abnormal; Notable for the following components:   RBC 3.40 (*)    Hemoglobin 10.8 (*)    HCT 30.0 (*)    All other components within normal limits  HCG, QUANTITATIVE, PREGNANCY - Abnormal; Notable for the following components:   hCG, Beta Chain, Quant, S 45,859 (*)    All other components within normal limits  TROPONIN I (HIGH SENSITIVITY)   ____________________________________________  EKG  ED ECG REPORT I, Merwyn Katos, the attending physician, personally viewed and interpreted this ECG.  Date: 05/31/2021 EKG Time: 1805 Rate: 74 Rhythm: normal sinus rhythm QRS Axis: normal Intervals: normal ST/T Wave abnormalities: normal Narrative Interpretation: no evidence of acute ischemia   PROCEDURES  Procedure(s) performed (including Critical Care):  .1-3 Lead EKG Interpretation  Date/Time: 05/31/2021 7:30 PM Performed by: Merwyn Katos, MD Authorized by: Merwyn Katos, MD     Interpretation: normal     ECG rate:  84   ECG rate assessment: normal     Rhythm: sinus rhythm     Ectopy: none     Conduction: normal     ____________________________________________   INITIAL IMPRESSION / ASSESSMENT AND PLAN / ED COURSE  As part of my medical decision making, I reviewed the following data within the electronic medical record, if available:  Nursing notes reviewed and  incorporated, Labs reviewed, EKG interpreted, Old chart reviewed, Radiograph reviewed and Notes from prior ED visits reviewed and incorporated        Workup: ECG, CXR, CBC, BMP, Troponin Findings: ECG: No overt evidence of STEMI. No evidence of Brugadas sign, delta wave, epsilon wave, significantly prolonged QTc, or malignant arrhythmia HS Troponin: Negative x1 Other Labs unremarkable for emergent problems. CXR: Without PTX, PNA, or widened mediastinum Last Stress Test: Never Last Heart Catheterization: Never HEART Score: 1  Given History, Exam, and Workup I have low suspicion for ACS, Pneumothorax, Pneumonia, Pulmonary Embolus, Tamponade, Aortic Dissection or other emergent problem as a cause for this presentation.   Reassesment: Prior to discharge patients pain was controlled and they were well appearing.  Disposition:  Discharge. Strict return precautions discussed with patient with full understanding. Advised patient to follow up promptly with primary care provider       ____________________________________________   FINAL CLINICAL IMPRESSION(S) / ED DIAGNOSES  Final diagnoses:  Left-sided chest wall pain     ED Discharge Orders     None  Note:  This document was prepared using Dragon voice recognition software and may include unintentional dictation errors.    Merwyn Katos, MD 05/31/21 [phone]

## 2021-05-31 NOTE — ED Triage Notes (Signed)
Patient is [redacted] weeks pregnant. C/o intermittent chest pain that she has recently been seen for. OBGYN told patient to come to ER. Denies abdominal pain or discharge

## 2021-08-31 ENCOUNTER — Encounter: Payer: Self-pay | Admitting: Obstetrics and Gynecology

## 2021-08-31 ENCOUNTER — Other Ambulatory Visit: Payer: Self-pay

## 2021-08-31 ENCOUNTER — Inpatient Hospital Stay
Admission: EM | Admit: 2021-08-31 | Discharge: 2021-09-01 | DRG: 833 | Disposition: A | Discharge status: Other Acute Inpatient Hospital | Payer: Managed Care, Other (non HMO) | Attending: Obstetrics and Gynecology | Admitting: Obstetrics and Gynecology

## 2021-08-31 DIAGNOSIS — O4703 False labor before 37 completed weeks of gestation, third trimester: Secondary | ICD-10-CM

## 2021-08-31 DIAGNOSIS — D509 Iron deficiency anemia, unspecified: Secondary | ICD-10-CM | POA: Diagnosis present

## 2021-08-31 DIAGNOSIS — O99013 Anemia complicating pregnancy, third trimester: Secondary | ICD-10-CM | POA: Diagnosis present

## 2021-08-31 DIAGNOSIS — Z91018 Allergy to other foods: Secondary | ICD-10-CM

## 2021-08-31 DIAGNOSIS — Z3A29 29 weeks gestation of pregnancy: Secondary | ICD-10-CM | POA: Diagnosis not present

## 2021-08-31 DIAGNOSIS — Z20822 Contact with and (suspected) exposure to covid-19: Secondary | ICD-10-CM | POA: Diagnosis present

## 2021-08-31 DIAGNOSIS — Z79899 Other long term (current) drug therapy: Secondary | ICD-10-CM | POA: Diagnosis not present

## 2021-08-31 DIAGNOSIS — Z23 Encounter for immunization: Secondary | ICD-10-CM | POA: Diagnosis not present

## 2021-08-31 DIAGNOSIS — O09219 Supervision of pregnancy with history of pre-term labor, unspecified trimester: Secondary | ICD-10-CM | POA: Diagnosis present

## 2021-08-31 DIAGNOSIS — Z2821 Immunization not carried out because of patient refusal: Secondary | ICD-10-CM

## 2021-08-31 LAB — URINALYSIS, COMPLETE (UACMP) WITH MICROSCOPIC
Bacteria, UA: NONE SEEN
Bilirubin Urine: NEGATIVE
Glucose, UA: NEGATIVE mg/dL
Hgb urine dipstick: NEGATIVE
Ketones, ur: NEGATIVE mg/dL
Leukocytes,Ua: NEGATIVE
Nitrite: NEGATIVE
Protein, ur: NEGATIVE mg/dL
Specific Gravity, Urine: 1.024 (ref 1.005–1.030)
pH: 6 (ref 5.0–8.0)

## 2021-08-31 LAB — CBC
HCT: 31.8 % — ABNORMAL LOW (ref 36.0–46.0)
Hemoglobin: 11.3 g/dL — ABNORMAL LOW (ref 12.0–15.0)
MCH: 31.1 pg (ref 26.0–34.0)
MCHC: 35.5 g/dL (ref 30.0–36.0)
MCV: 87.6 fL (ref 80.0–100.0)
Platelets: 192 10*3/uL (ref 150–400)
RBC: 3.63 MIL/uL — ABNORMAL LOW (ref 3.87–5.11)
RDW: 11.7 % (ref 11.5–15.5)
WBC: 10.2 10*3/uL (ref 4.0–10.5)
nRBC: 0 % (ref 0.0–0.2)

## 2021-08-31 LAB — URINE DRUG SCREEN, QUALITATIVE (ARMC ONLY)
Amphetamines, Ur Screen: NOT DETECTED
Barbiturates, Ur Screen: NOT DETECTED
Benzodiazepine, Ur Scrn: NOT DETECTED
Cannabinoid 50 Ng, Ur ~~LOC~~: NOT DETECTED
Cocaine Metabolite,Ur ~~LOC~~: NOT DETECTED
MDMA (Ecstasy)Ur Screen: NOT DETECTED
Methadone Scn, Ur: NOT DETECTED
Opiate, Ur Screen: NOT DETECTED
Phencyclidine (PCP) Ur S: NOT DETECTED
Tricyclic, Ur Screen: NOT DETECTED

## 2021-08-31 LAB — WET PREP, GENITAL
Clue Cells Wet Prep HPF POC: NONE SEEN
Sperm: NONE SEEN
Trich, Wet Prep: NONE SEEN
WBC, Wet Prep HPF POC: >10 — AB (ref <10)
Yeast Wet Prep HPF POC: NONE SEEN

## 2021-08-31 LAB — TYPE AND SCREEN
ABO/RH(D): B POS
Antibody Screen: NEGATIVE

## 2021-08-31 MED ORDER — OXYTOCIN BOLUS FROM INFUSION: 333.0000 mL | Freq: Once | INTRAVENOUS | Status: DC

## 2021-08-31 MED ORDER — LACTATED RINGERS IV SOLN: 500.0000 mL | INTRAVENOUS | Status: DC | PRN

## 2021-08-31 MED ORDER — ONDANSETRON HCL 4 MG/2ML IJ SOLN
4.0000 mg | Freq: Four times a day (QID) | INTRAMUSCULAR | Status: DC | PRN
  Administered 2021-08-31: 4 mg via INTRAVENOUS
  Filled 2021-08-31: qty 2

## 2021-08-31 MED ORDER — SODIUM CHLORIDE 0.9 % IV SOLN
5.0000 10*6.[IU] | Freq: Once | INTRAVENOUS | Status: AC
  Administered 2021-09-01: 5 10*6.[IU] via INTRAVENOUS
  Filled 2021-08-31 (×2): qty 5

## 2021-08-31 MED ORDER — BETAMETHASONE SOD PHOS & ACET 6 (3-3) MG/ML IJ SUSP
12.0000 mg | INTRAMUSCULAR | Status: DC
  Administered 2021-08-31: 12 mg via INTRAMUSCULAR
  Filled 2021-08-31: qty 5

## 2021-08-31 MED ORDER — ACETAMINOPHEN 325 MG PO TABS: 650.0000 mg | ORAL_TABLET | ORAL | Status: DC | PRN

## 2021-08-31 MED ORDER — LIDOCAINE HCL (PF) 1 % IJ SOLN: 30.0000 mL | INTRAMUSCULAR | Status: DC | PRN

## 2021-08-31 MED ORDER — NIFEDIPINE 10 MG PO CAPS: 10.0000 mg | ORAL_CAPSULE | Freq: Four times a day (QID) | ORAL | Status: DC

## 2021-08-31 MED ORDER — PENICILLIN G POT IN DEXTROSE 60000 UNIT/ML IV SOLN: 3.0000 10*6.[IU] | INTRAVENOUS | Status: DC

## 2021-08-31 MED ORDER — NIFEDIPINE 10 MG PO CAPS
20.0000 mg | ORAL_CAPSULE | Freq: Once | ORAL | Status: AC
  Administered 2021-08-31: 20 mg via ORAL
  Filled 2021-08-31: qty 2

## 2021-08-31 MED ORDER — SOD CITRATE-CITRIC ACID 500-334 MG/5ML PO SOLN: 30.0000 mL | ORAL | Status: DC | PRN

## 2021-08-31 MED ORDER — LACTATED RINGERS IV SOLN: INTRAVENOUS | Status: DC

## 2021-08-31 MED ORDER — MAGNESIUM SULFATE BOLUS VIA INFUSION
6.0000 g | Freq: Once | INTRAVENOUS | Status: AC
  Administered 2021-08-31: 23:00:00 6 g via INTRAVENOUS
  Filled 2021-08-31: qty 1000

## 2021-08-31 MED ORDER — NIFEDIPINE 10 MG PO CAPS
ORAL_CAPSULE | ORAL | Status: AC
  Filled 2021-08-31: qty 1

## 2021-08-31 MED ORDER — OXYTOCIN-SODIUM CHLORIDE 30-0.9 UT/500ML-% IV SOLN: 2.5000 [IU]/h | INTRAVENOUS | Status: DC

## 2021-08-31 MED ORDER — MAGNESIUM SULFATE 40 GM/1000ML IV SOLN
2.0000 g/h | INTRAVENOUS | Status: DC
  Filled 2021-08-31: qty 1000

## 2021-08-31 NOTE — Progress Notes (Signed)
TC to [facility] transfer center for requested transfer due to preterm labor.   Transfer accepted by [facility] attending, Dr Birdena Jubilee at [phone].  Transport set up with [facility].    Randa Ngo, CNM 08/31/2021 11:53 PM

## 2021-08-31 NOTE — H&P (Addendum)
OB History & Physical   History of Present Illness:  Chief Complaint: preterm contractions.   HPI:  Carol Schmitt is a 20 y.o. G2P1001 female at [redacted]w[redacted]d dated by [place] at [redacted]w[redacted]d; EDD 11/13/21.  She presents to L&D for increased BH contractions this evening, reports having had UCs off and on for last few weeks. Increased PO hydration today and rested but UCs continued. Feels menstrual like cramp and dull backache with abdominal tightness. Thinks she lost her mucus plug last week, denies LOF or VB. No recent IC and very active FM   Pregnancy Issues: 1. Chlamydia at NOB 05/2021 and again 08/10/21; Neg TOC 08/31/21 2. Hx shoulder dystocia and PPH w/ EBL 3. Iron deficiency anemia 4. 1hr GTT 135   Maternal Medical History:   Past Medical History:  Diagnosis Date   Medical history non-contributory     Past Surgical History:  Procedure Laterality Date   NO PAST SURGERIES      Allergies  Allergen Reactions   Apple Other (See Comments)    Mouth discomfort    Prior to Admission medications   Medication Sig Start Date End Date Taking? Authorizing Provider  Prenatal Vit-Fe Fumarate-FA (MULTIVITAMIN-PRENATAL) 27-0.8 MG TABS tablet Take 1 tablet by mouth daily at 12 noon.   Yes [provider]  EPINEPHrine (EPIPEN 2-PAK) 0.3 mg/0.3 mL IJ SOAJ injection Inject 0.3 mg into the muscle as needed for anaphylaxis. 05/20/21   Merwyn Katos, MD  ferrous sulfate 325 (65 FE) MG tablet Take 1 tablet (325 mg total) by mouth daily with breakfast. Take with Vitamin C 05/04/20 07/03/20  Christeen Douglas, MD  NIFEdipine (PROCARDIA-XL/NIFEDICAL-XL) 30 MG 24 hr tablet C Patient not taking: Reported on 08/31/2021 05/04/20   Christeen Douglas, MD     Prenatal care site: [facility]    Social History: She  reports that she has never smoked. She has never used smokeless tobacco. She reports that she does not currently use alcohol. She reports that she does not currently use drugs after  having used the following drugs: Marijuana.  Family History: no family hx Gyn cancers  Review of Systems: A full review of systems was performed and negative except as noted in the HPI.     Physical Exam:  Vital Signs: BP 115/73 (BP Location: Left Arm)   Pulse 85   Temp 98.3 F (36.8 C) (Oral)   Resp 16   Ht 5\' 2"  (1.575 m)   Wt 63.5 kg   BMI 25.61 kg/m   General: no acute distress.  HEENT: normocephalic, atraumatic Heart: regular rate & rhythm.  No murmurs/rubs/gallops Lungs: clear to auscultation bilaterally, normal respiratory effort Abdomen: soft, gravid, non-tender;  EFW: 3lbs Pelvic:   External: Normal external female genitalia  BBOW noted on speculum exam, mod amt yellow DC and cervical mucus. Mild erythema.   Extremities: non-tender, symmetric, no edema bilaterally.  DTRs: 2+  Neurologic: Alert & oriented x 3.    No results found for this or any previous visit (from the past 24 hour(s)).  Pertinent Results:  Prenatal Labs: Blood type/Rh B  pos  Antibody screen neg  Rubella Immune  Varicella Immune  RPR NR  HBsAg Neg  HIV NR  GC neg  Chlamydia neg  Genetic screening negative  1 hour GTT 135  3 hour GTT Not done  GBS pending   FHT: 140bpm, mod variability, + accels, no decels.  TOCO: q4-15min, palp mild to mod.  SVE:  3/75/-3, posterior, soft  with BBOW.    Cephalic by bedside US  No results found.  Assessment:  Carol Schmitt is a 20 y.o. G9P1001 female at [redacted]w[redacted]d with preterm labor.   Plan:  1. Admit to Labor & Delivery; consents reviewed and obtained - Preterm labor eval with BBOW noted on exam - Magnesium started for neuroprotection, 6gm load then 2gram per hour.  - Nifedipine PO ordered - initial BMZ given - GBS by PCR, wet prep and admit labs ordered.  - transfer of care initiated due to prematurity. Dr Leander Rams, [facility] attending at [facility], unable to accept transfer due to ICN on divert and potential for unstable during transfer with BBOW.   2.  Fetal Well being  - Fetal Tracing: Cat I tracing - Group B Streptococcus ppx indicated: PCR pending, PCN ordered.  - Presentation: cephalic confirmed by bedside US.    3. Routine OB: - Prenatal labs reviewed, as above - Rh B Pos - CBC, T&S, RPR on admit - Clear fluids, IVF  4. Monitoring of Labor -  Contractions: external toco in place -  Pelvis proven to 2940grams -  Plan for continuous fetal monitoring   5. Post Partum Planning: - tdap given 08/31/21 - flu vaccine declined  Randa Ngo, CNM 08/31/21 10:25 PM

## 2021-08-31 NOTE — OB Triage Note (Signed)
Pt Carol Schmitt 20 y.o. presents to labor and delivery triage reporting   . Pt is a G2P1001 at [redacted]w[redacted]d . Pt denies signs and symptons consistent with rupture of membranes or active vaginal bleeding. Pt states she's having contractions (braxton hicks contractions starting at about 24 weeks) and states positive fetal movement. States that she has urinary frequency, darker urine, denies pain with urinating. States that she is having large amounts of white discharge. External FM and TOCO applied to non-tender abdomen and assessing. Initial FHR 155. Vital signs obtained and within normal limits. Provider notified of pt.

## 2021-09-01 LAB — RPR: RPR Ser Ql: NONREACTIVE

## 2021-09-01 LAB — RESP PANEL BY RT-PCR (FLU A&B, COVID) ARPGX2
Influenza A by PCR: NEGATIVE
Influenza B by PCR: NEGATIVE
SARS Coronavirus 2 by RT PCR: NEGATIVE

## 2021-09-01 LAB — GROUP B STREP BY PCR: Group B strep by PCR: POSITIVE — AB

## 2021-09-01 LAB — FETAL FIBRONECTIN: Fetal Fibronectin: POSITIVE — AB

## 2021-09-03 ENCOUNTER — Other Ambulatory Visit: Payer: Self-pay

## 2021-09-03 ENCOUNTER — Encounter: Payer: Self-pay | Admitting: Obstetrics and Gynecology

## 2021-09-03 ENCOUNTER — Observation Stay
Admission: EM | Admit: 2021-09-03 | Discharge: 2021-09-05 | Disposition: A | Discharge status: Home / Self Care | Payer: Managed Care, Other (non HMO) | Attending: Obstetrics and Gynecology | Admitting: Obstetrics and Gynecology

## 2021-09-03 DIAGNOSIS — O09219 Supervision of pregnancy with history of pre-term labor, unspecified trimester: Secondary | ICD-10-CM | POA: Diagnosis present

## 2021-09-03 DIAGNOSIS — Z3A30 30 weeks gestation of pregnancy: Secondary | ICD-10-CM | POA: Diagnosis not present

## 2021-09-03 DIAGNOSIS — Z3686 Encounter for antenatal screening for cervical length: Secondary | ICD-10-CM

## 2021-09-03 MED ORDER — TERBUTALINE SULFATE 1 MG/ML IJ SOLN
INTRAMUSCULAR | Status: AC
  Administered 2021-09-03: 1 mg
  Filled 2021-09-03: qty 1

## 2021-09-03 MED ORDER — ACETAMINOPHEN 325 MG PO TABS
650.0000 mg | ORAL_TABLET | ORAL | Status: DC | PRN
  Administered 2021-09-04 – 2021-09-05 (×7): 650 mg via ORAL
  Filled 2021-09-03 (×7): qty 2

## 2021-09-03 MED ORDER — LACTATED RINGERS IV SOLN: INTRAVENOUS | Status: DC

## 2021-09-03 MED ORDER — ONDANSETRON HCL 4 MG/2ML IJ SOLN: 4.0000 mg | Freq: Four times a day (QID) | INTRAMUSCULAR | Status: DC | PRN

## 2021-09-03 MED ORDER — TERBUTALINE SULFATE 1 MG/ML IJ SOLN
0.2500 mg | INTRAMUSCULAR | Status: DC | PRN
  Filled 2021-09-03: qty 1

## 2021-09-03 MED ORDER — LACTATED RINGERS IV BOLUS
500.0000 mL | Freq: Once | INTRAVENOUS | Status: AC
  Administered 2021-09-03: 500 mL via INTRAVENOUS

## 2021-09-03 MED ORDER — TERBUTALINE SULFATE 1 MG/ML IJ SOLN: 0.2500 mg | INTRAMUSCULAR | Status: DC | PRN

## 2021-09-04 ENCOUNTER — Observation Stay: Payer: Managed Care, Other (non HMO)

## 2021-09-04 LAB — URINALYSIS, COMPLETE (UACMP) WITH MICROSCOPIC
Bacteria, UA: NONE SEEN
Bilirubin Urine: NEGATIVE
Glucose, UA: NEGATIVE mg/dL
Hgb urine dipstick: NEGATIVE
Ketones, ur: 5 mg/dL — AB
Leukocytes,Ua: NEGATIVE
Nitrite: NEGATIVE
Protein, ur: NEGATIVE mg/dL
Specific Gravity, Urine: 1.021 (ref 1.005–1.030)
pH: 6 (ref 5.0–8.0)

## 2021-09-04 LAB — WET PREP, GENITAL
Clue Cells Wet Prep HPF POC: NONE SEEN
Sperm: NONE SEEN
Trich, Wet Prep: NONE SEEN
Yeast Wet Prep HPF POC: NONE SEEN

## 2021-09-04 MED ORDER — DOCUSATE SODIUM 100 MG PO CAPS
100.0000 mg | ORAL_CAPSULE | Freq: Two times a day (BID) | ORAL | Status: DC
  Administered 2021-09-04 – 2021-09-05 (×2): 100 mg via ORAL
  Filled 2021-09-04 (×2): qty 1

## 2021-09-04 MED ORDER — NIFEDIPINE 10 MG PO CAPS
10.0000 mg | ORAL_CAPSULE | Freq: Four times a day (QID) | ORAL | Status: DC
  Administered 2021-09-04 – 2021-09-05 (×4): 10 mg via ORAL
  Filled 2021-09-04 (×4): qty 1

## 2021-09-04 NOTE — H&P (Signed)
OB History & Physical   History of Present Illness:  Chief Complaint:   HPI:  Carol Schmitt is a 20 y.o. G2P1001 female at [redacted]w[redacted]d dated by u/s at 14 weeks.  She presents to L&D for uterine contractions on arrival. IVF and a dose of terbutaline stopped the UCs however pt reports cramping in her back that wraps around to her lower abdomin.  Recent admission to [facility] for PTL on 11/16 (d/c 11/18) - 3-4 cm - BMX x2 doses - Mag Sulfate - Indocin for tocolysis  She reports:  -active fetal movement -no leakage of fluid -no vaginal bleeding -onset of contractions at earlier today  Pregnancy Issues: 1. Chlamydia at NOB 05/2021 and again 08/10/21; Neg TOC 08/31/21 2. Hx shoulder dystocia and PPH w/ EBL 3. Iron deficiency anemia 4. 1hr GTT 135   Maternal Medical History:   Past Medical History:  Diagnosis Date   Medical history non-contributory     Past Surgical History:  Procedure Laterality Date   NO PAST SURGERIES      Allergies  Allergen Reactions   Apple Other (See Comments)    Mouth discomfort    Prior to Admission medications   Medication Sig Start Date End Date Taking? Authorizing Provider  Prenatal Vit-Fe Fumarate-FA (MULTIVITAMIN-PRENATAL) 27-0.8 MG TABS tablet Take 1 tablet by mouth daily at 12 noon.   Yes [provider]  EPINEPHrine (EPIPEN 2-PAK) 0.3 mg/0.3 mL IJ SOAJ injection Inject 0.3 mg into the muscle as needed for anaphylaxis. 05/20/21   Merwyn Katos, MD  ferrous sulfate 325 (65 FE) MG tablet Take 1 tablet (325 mg total) by mouth daily with breakfast. Take with Vitamin C 05/04/20 07/03/20  Christeen Douglas, MD  NIFEdipine (PROCARDIA-XL/NIFEDICAL-XL) 30 MG 24 hr tablet C Patient not taking: Reported on 08/31/2021 05/04/20   Christeen Douglas, MD     Prenatal care site: [facility]   Social History: She  reports that she has never smoked. She has never used smokeless tobacco. She reports that she does not currently use alcohol. She  reports that she does not currently use drugs after having used the following drugs: Marijuana.  Family History: family history is not on file.   Review of Systems: A full review of systems was performed and negative except as noted in the HPI.    Physical Exam:  Vital Signs: BP (!) 116/52 (BP Location: Right Arm)   Pulse (!) 126   Temp 97.9 F (36.6 C) (Oral)   Resp 18   SpO2 100%   General:   alert and cooperative  Skin:  normal  Neurologic:    Alert & oriented x 3  Lungs:    Nl effort  Heart:   regular rate and rhythm  Abdomen:   gravid  Extremities: : non-tender, symmetric, no edema bilaterally.      Results for orders placed or performed during the hospital encounter of 09/03/21 (from the past 24 hour(s))  Wet prep, genital     Status: Abnormal   Collection Time: 09/04/21  1:29 AM   Specimen: Urine, Clean Catch  Result Value Ref Range   Yeast Wet Prep HPF POC NONE SEEN NONE SEEN   Trich, Wet Prep NONE SEEN NONE SEEN   Clue Cells Wet Prep HPF POC NONE SEEN NONE SEEN   WBC, Wet Prep HPF POC FEW (A) <10   Sperm NONE SEEN   Urinalysis, Complete w Microscopic Urine, Clean Catch     Status: Abnormal  Collection Time: 09/04/21  1:34 AM  Result Value Ref Range   Color, Urine YELLOW (A) YELLOW   APPearance CLEAR (A) CLEAR   Specific Gravity, Urine 1.021 1.005 - 1.030   pH 6.0 5.0 - 8.0   Glucose, UA NEGATIVE NEGATIVE mg/dL   Hgb urine dipstick NEGATIVE NEGATIVE   Bilirubin Urine NEGATIVE NEGATIVE   Ketones, ur 5 (A) NEGATIVE mg/dL   Protein, ur NEGATIVE NEGATIVE mg/dL   Nitrite NEGATIVE NEGATIVE   Leukocytes,Ua NEGATIVE NEGATIVE   RBC / HPF 0-5 0 - 5 RBC/hpf   WBC, UA 0-5 0 - 5 WBC/hpf   Bacteria, UA NONE SEEN NONE SEEN   Squamous Epithelial / LPF 0-5 0 - 5   Mucus PRESENT     Pertinent Results:  Prenatal Labs: Blood type/Rh B  pos  Antibody screen neg  Rubella Immune  Varicella Immune  RPR NR  HBsAg Neg  HIV NR  GC neg  Chlamydia neg  Genetic screening  negative  1 hour GTT 135  3 hour GTT Not done  GBS pos   FHT: FHR: 150 bpm, variability: moderate,  accelerations:  Present,  decelerations:  Absent Category/reactivity:  Category I TOCO: none on toco SVE: deferred, see u/s below  US OB Limited  Result Date: 09/04/2021 CLINICAL DATA:  Cervical length. EXAM: LIMITED OBSTETRIC ULTRASOUND AND TRANSVAGINAL OBSTETRIC ULTRASOUND FINDINGS: Number of Fetuses: 1 Heart Rate:  176 bpm Movement: Yes Presentation: Cephalic Placental Location: Posterior, lateral right Previa: No Amniotic Fluid (Subjective): Within normal limits. BPD:  7.2cm 28w 5d MATERNAL FINDINGS: Cervix: Funneled in opened by 1.1 cm. Cervix length: 2.9 cm Uterus/Adnexae:  No abnormality visualized.  Nabothian cysts noted. Other: Per ultrasound sonographer, patient having contractions during exam. IMPRESSION: 1. Single live intrauterine pregnancy with a gestational age of [redacted] weeks and 5 days per ultrasound. 2. Cervical length 2.9 cm, funneled. 3. Per ultrasound sonographer, patient having contractions during exam. This exam is performed on an emergent basis and does not comprehensively evaluate fetal size, dating, or anatomy; follow-up complete OB US should be considered if further fetal assessment is warranted. Electronically Signed   By: Tish Frederickson M.D.   On: 09/04/2021 01:35   US OB Transvaginal  Result Date: 09/04/2021 CLINICAL DATA:  Cervical length. EXAM: LIMITED OBSTETRIC ULTRASOUND AND TRANSVAGINAL OBSTETRIC ULTRASOUND FINDINGS: Number of Fetuses: 1 Heart Rate:  176 bpm Movement: Yes Presentation: Cephalic Placental Location: Posterior, lateral right Previa: No Amniotic Fluid (Subjective): Within normal limits. BPD:  7.2cm 28w 5d MATERNAL FINDINGS: Cervix: Funneled in opened by 1.1 cm. Cervix length: 2.9 cm Uterus/Adnexae:  No abnormality visualized.  Nabothian cysts noted. Other: Per ultrasound sonographer, patient having contractions during exam. IMPRESSION: 1. Single live  intrauterine pregnancy with a gestational age of [redacted] weeks and 5 days per ultrasound. 2. Cervical length 2.9 cm, funneled. 3. Per ultrasound sonographer, patient having contractions during exam. This exam is performed on an emergent basis and does not comprehensively evaluate fetal size, dating, or anatomy; follow-up complete OB US should be considered if further fetal assessment is warranted. Electronically Signed   By: Tish Frederickson M.D.   On: 09/04/2021 01:35     Assessment:  Carol Schmitt is a 20 y.o. G18P1001 female at [redacted]w[redacted]d with preterm labor.   Plan:  1. Preterm labor - s/p BMX and Mag sulfate - Terbutaline 1mg  given at 2311, pt refused second dose at 0240 - C/w Dr 2312 - POC to observe over night  2. Fetal Well being  -  Fetal Tracing: Cat I - GBS pos - Presentation: vtx confirmed by u/s    Atiya Yera, CNM 09/04/2021 2:26 AM

## 2021-09-04 NOTE — Progress Notes (Signed)
ANTEPARTUM PROGRESS NOTE  Carol Schmitt is a 20 y.o. G2P1001 at [redacted]w[redacted]d who is admitted for Preterm labor.   Estimated Date of Delivery: 11/13/21  Length of Stay:  0 Days. Admitted 09/03/2021  Subjective: Pt reports continued back pain that radiates to her lower abdomen that comes and goes like UCs. Back pain is improved with Tylenol.  She reports:  -active fetal movement -no leakage of fluid -no vaginal bleeding  Vitals:  BP (!) 109/56 (BP Location: Right Arm)   Pulse 76   Temp 98.1 F (36.7 C) (Oral)   Resp 14   SpO2 100%  Physical Examination: General:   alert and cooperative  Skin:  normal  Neurologic:    Alert & oriented x 3  Lungs:    Nl effort  Heart:   regular rate and rhythm  Abdomen:  soft, non-tender; bowel sounds normal; no masses,  no organomegaly  Pelvis:  Exam deferred.  Extremities: : non-tender, symmetric, no edema bilaterally.      Fetal monitoring: FHR: 150 bpm, Variability: moderate, Accelerations: Present, Decelerations: Absent, Presentation: Vtx Uterine activity: 3 contractions per hour  Results for orders placed or performed during the hospital encounter of 09/03/21 (from the past 48 hour(s))  Wet prep, genital     Status: Abnormal   Collection Time: 09/04/21  1:29 AM   Specimen: Urine, Clean Catch  Result Value Ref Range   Yeast Wet Prep HPF POC NONE SEEN NONE SEEN   Trich, Wet Prep NONE SEEN NONE SEEN   Clue Cells Wet Prep HPF POC NONE SEEN NONE SEEN   WBC, Wet Prep HPF POC FEW (A) <10    Comment: Please note change in reference range.   Sperm NONE SEEN     Comment: Performed at [facility], [address] [place], [place] [address]  Urinalysis, Complete w Microscopic Urine, Clean Catch     Status: Abnormal   Collection Time: 09/04/21  1:34 AM  Result Value Ref Range   Color, Urine YELLOW (A) YELLOW   APPearance CLEAR (A) CLEAR   Specific Gravity, Urine 1.021 1.005 - 1.030   pH 6.0 5.0 - 8.0   Glucose, UA NEGATIVE NEGATIVE  mg/dL   Hgb urine dipstick NEGATIVE NEGATIVE   Bilirubin Urine NEGATIVE NEGATIVE   Ketones, ur 5 (A) NEGATIVE mg/dL   Protein, ur NEGATIVE NEGATIVE mg/dL   Nitrite NEGATIVE NEGATIVE   Leukocytes,Ua NEGATIVE NEGATIVE   RBC / HPF 0-5 0 - 5 RBC/hpf   WBC, UA 0-5 0 - 5 WBC/hpf   Bacteria, UA NONE SEEN NONE SEEN   Squamous Epithelial / LPF 0-5 0 - 5   Mucus PRESENT     Comment: Performed at [facility], [address] [place], [place] [address]    [employer] [employer]  Result Date: 09/04/2021 CLINICAL DATA:  Cervical length. EXAM: LIMITED OBSTETRIC ULTRASOUND AND TRANSVAGINAL OBSTETRIC ULTRASOUND FINDINGS: Number of Fetuses: 1 Heart Rate:  176 bpm Movement: Yes Presentation: Cephalic Placental Location: Posterior, lateral right Previa: No Amniotic Fluid (Subjective): Within normal limits. BPD:  7.2cm 28w 5d MATERNAL FINDINGS: Cervix: Funneled in opened by 1.1 cm. Cervix length: 2.9 cm Uterus/Adnexae:  No abnormality visualized.  Nabothian cysts noted. Other: Per ultrasound sonographer, patient having contractions during exam. IMPRESSION: 1. Single live intrauterine pregnancy with a gestational age of [redacted] weeks and 5 days per ultrasound. 2. Cervical length 2.9 cm, funneled. 3. Per ultrasound sonographer, patient having contractions during exam. This exam is performed on an emergent  basis and does not comprehensively evaluate fetal size, dating, or anatomy; follow-up complete OB US should be considered if further fetal assessment is warranted. Electronically Signed   By: Tish Frederickson M.D.   On: 09/04/2021 01:35   US OB Transvaginal  Result Date: 09/04/2021 CLINICAL DATA:  Cervical length. EXAM: LIMITED OBSTETRIC ULTRASOUND AND TRANSVAGINAL OBSTETRIC ULTRASOUND FINDINGS: Number of Fetuses: 1 Heart Rate:  176 bpm Movement: Yes Presentation: Cephalic Placental Location: Posterior, lateral right Previa: No Amniotic Fluid (Subjective): Within normal limits. BPD:  7.2cm 28w 5d MATERNAL FINDINGS:  Cervix: Funneled in opened by 1.1 cm. Cervix length: 2.9 cm Uterus/Adnexae:  No abnormality visualized.  Nabothian cysts noted. Other: Per ultrasound sonographer, patient having contractions during exam. IMPRESSION: 1. Single live intrauterine pregnancy with a gestational age of [redacted] weeks and 5 days per ultrasound. 2. Cervical length 2.9 cm, funneled. 3. Per ultrasound sonographer, patient having contractions during exam. This exam is performed on an emergent basis and does not comprehensively evaluate fetal size, dating, or anatomy; follow-up complete OB US should be considered if further fetal assessment is warranted. Electronically Signed   By: Tish Frederickson M.D.   On: 09/04/2021 01:35    Current scheduled medications  docusate sodium  100 mg Oral BID   NIFEdipine  10 mg Oral Q6H    I have reviewed the patient's current medications.  ASSESSMENT: Patient Active Problem List   Diagnosis Date Noted   Preterm uterine contractions in third trimester, antepartum 08/31/2021   Preterm labor 08/31/2021   NSVD (normal spontaneous vaginal delivery) 05/02/2020   Postpartum hemorrhage 05/02/2020   Shoulder dystocia, delivered 05/02/2020   Pre-eclampsia 05/01/2020   Groin pain 03/24/2020    PLAN:  Continue routine antenatal care.  1. Preterm labor - s/p BMX and Mag sulfate - Terbutaline 1mg  given at 2311, pt refused second dose at 0240 - Procardia 10mg  q6hrs for tocolysis - Tylenol for pain management - Continue to observe   2. Fetal Well being  - Fetal Tracing: Cat I - GBS pos - Presentation: vtx confirmed by u/s    2312, CNM 09/04/2021 12:48 PM  Cyril Mourning Certified Nurse Midwife [facility]

## 2021-09-04 NOTE — OB Triage Note (Signed)
Patient is a 20 yo, G2P1, at 29 weeks 6 days. Patient presents with complaints of contractions that started earlier today. Patient denies any vaginal bleeding or LOF. Pt reports +FM. Monitors applied and assessing. VSS. Initial fetal heart tone 155. Will continue to monitor.

## 2021-09-04 NOTE — Consult Note (Signed)
   Consultation Service: Neonatology   Dr. Ouida Sills has asked for consultation on [place] regarding the care of a premature infant at 52 weeks. Thank you for inviting [place] to see this patient.   Reason for consult:  Explain the possible complications, the prognosis, and the care of a premature infant at 3 and 0/7 weeks.  Chief complaint: 20 y.o. female with a 30 week IUP with an unknown estimated weight. Pregnancy has been complicated by  preterm labor (Seen at [facility] for PTL on 11/16 and discharged home 11/18) .  Plan is for delivery via ceasarean section/vaginal delivery if possible.  My key findings of this patient's HPI are:  I have reviewed the patient's chart and have met with her. The salient information is as follows:   Prenatal labs:   Prenatal care:   good Pregnancy complications:  Chlamydia on 8/22 and again 08/10/21 with negative TOC 08/31/21. H/o shoulder dystocia, iron deficiency anemia and a 1h GTT of 135. Maternal antibiotics: This patient's mother is not on file. Maternal Steroids: BMZ x2 doses during recent 11/16 [facility] admission Most recent dose:  11/17    My recommendations for this patient and my actions included:   1. In the presence of the [employer] and the father of the baby, I spent 25 minutes discussing the possible complications and outcomes of prematurity at this gestational age. I discussed specific complications at this gestational age referencing the need for resuscitation at birth, mechanical ventilation and surfactant administration for respiratory distress, IV fluids pending establishment of enteral feeds (encouraged breast milk feeding), antibiotics for possible sepsis, temperature support, and monitoring. I also discussed the potential risk of complications such as intracranial hemorrhage, retinopathy, hearing deficit, and chronic lung disease. I discussed this with parents in detail and they expressed an understanding of the risks and  complications of prematurity.   2. I also discussed the expected survival of an infant born at 65 weeks, which is (Good). We further discussed that (Few) of the neonates born at this age have profound or severe neurological complications and school difficulties. Also, (Few) of the neonates born at this age will have some for of mild to moderate neurological complications. She expressed an understanding of this information.   3. I informed her that the NICU team would be present at the delivery. She agreed that all appropriate medical measures could be taken to resuscitate her infant at the delivery. She also understood that depending on her infant's initial condition and NICU course, some difficult decisions may have to be made.   4. A visit to the NICU by the infant's mother and/or a significant other was encouraged. Visitation policy was discussed.   Final Impression:  20 y.o. female with a 30week IUP who is threatening to deliver and who now understands the possible complications and prognosis of her infant. The mother agrees with plan for resuscitation and ICU care. Fiorella L Grand's questions were answered. She is planning to try and provide breast milk for her infant.    ______________________________________________________________________  Thank you for asking [place] to participate in the care of this patient. Please do not hesitate to contact us again if you are aware of any further ways we can be of assistance.   Sincerely,  Edman Circle, MD Attending Neonatologist   I spent ~40 minutes in consultation time, of which 25 minutes was spent in direct face to face counseling.

## 2021-09-04 NOTE — Progress Notes (Signed)
ANTEPARTUM PROGRESS NOTE  Carol Schmitt is a 20 y.o. G2P1001 at [redacted]w[redacted]d who is admitted for Preterm labor.   Estimated Date of Delivery: 11/13/21  Length of Stay:  0 Days. Admitted 09/03/2021  Subjective: Pt reports continued back pain that radiates to her lower abdomen that comes and goes like UCs. Pain now is more intense, pt is visibly shaking.  She reports:  -active fetal movement -no leakage of fluid -no vaginal bleeding  Vitals:  BP (!) 125/56 (BP Location: Right Arm)   Pulse 84   Temp 98.3 F (36.8 C) (Oral)   Resp 16   SpO2 100%  Physical Examination: General:   alert, cooperative, and mild distress  Skin:  normal  Neurologic:    Alert & oriented x 3  Lungs:   Nl effort  Heart:   regular rate and rhythm  Abdomen:  gravid  Pelvis:  Exam deferred.  Cervix:    Dilation: 1cm   Effacement: 30%   Station:  Floating   Consistency: firm   Position: posterior  Extremities: : non-tender, symmetric, no edema bilaterally.    Fetal monitoring: FHR: 150 bpm, Variability: moderate, Accelerations: Present, Decelerations: Absent Uterine activity: 8 contractions per hour  Results for orders placed or performed during the hospital encounter of 09/03/21 (from the past 48 hour(s))  Wet prep, genital     Status: Abnormal   Collection Time: 09/04/21  1:29 AM   Specimen: Urine, Clean Catch  Result Value Ref Range   Yeast Wet Prep HPF POC NONE SEEN NONE SEEN   Trich, Wet Prep NONE SEEN NONE SEEN   Clue Cells Wet Prep HPF POC NONE SEEN NONE SEEN   WBC, Wet Prep HPF POC FEW (A) <10    Comment: Please note change in reference range.   Sperm NONE SEEN     Comment: Performed at [facility], [address] [place], [place] [address]  Urinalysis, Complete w Microscopic Urine, Clean Catch     Status: Abnormal   Collection Time: 09/04/21  1:34 AM  Result Value Ref Range   Color, Urine YELLOW (A) YELLOW   APPearance CLEAR (A) CLEAR   Specific Gravity, Urine 1.021 1.005 -  1.030   pH 6.0 5.0 - 8.0   Glucose, UA NEGATIVE NEGATIVE mg/dL   Hgb urine dipstick NEGATIVE NEGATIVE   Bilirubin Urine NEGATIVE NEGATIVE   Ketones, ur 5 (A) NEGATIVE mg/dL   Protein, ur NEGATIVE NEGATIVE mg/dL   Nitrite NEGATIVE NEGATIVE   Leukocytes,Ua NEGATIVE NEGATIVE   RBC / HPF 0-5 0 - 5 RBC/hpf   WBC, UA 0-5 0 - 5 WBC/hpf   Bacteria, UA NONE SEEN NONE SEEN   Squamous Epithelial / LPF 0-5 0 - 5   Mucus PRESENT     Comment: Performed at [facility], [address], [place], [place] [address]    [employer] [employer]  Result Date: 09/04/2021 CLINICAL DATA:  Cervical length. EXAM: LIMITED OBSTETRIC ULTRASOUND AND TRANSVAGINAL OBSTETRIC ULTRASOUND FINDINGS: Number of Fetuses: 1 Heart Rate:  176 bpm Movement: Yes Presentation: Cephalic Placental Location: Posterior, lateral right Previa: No Amniotic Fluid (Subjective): Within normal limits. BPD:  7.2cm 28w 5d MATERNAL FINDINGS: Cervix: Funneled in opened by 1.1 cm. Cervix length: 2.9 cm Uterus/Adnexae:  No abnormality visualized.  Nabothian cysts noted. Other: Per ultrasound sonographer, patient having contractions during exam. IMPRESSION: 1. Single live intrauterine pregnancy with a gestational age of [redacted] weeks and 5 days per ultrasound. 2. Cervical length 2.9 cm, funneled. 3.  Per ultrasound sonographer, patient having contractions during exam. This exam is performed on an emergent basis and does not comprehensively evaluate fetal size, dating, or anatomy; follow-up complete OB US should be considered if further fetal assessment is warranted. Electronically Signed   By: Tish Frederickson M.D.   On: 09/04/2021 01:35   US OB Transvaginal  Result Date: 09/04/2021 CLINICAL DATA:  Cervical length. EXAM: LIMITED OBSTETRIC ULTRASOUND AND TRANSVAGINAL OBSTETRIC ULTRASOUND FINDINGS: Number of Fetuses: 1 Heart Rate:  176 bpm Movement: Yes Presentation: Cephalic Placental Location: Posterior, lateral right Previa: No Amniotic Fluid (Subjective):  Within normal limits. BPD:  7.2cm 28w 5d MATERNAL FINDINGS: Cervix: Funneled in opened by 1.1 cm. Cervix length: 2.9 cm Uterus/Adnexae:  No abnormality visualized.  Nabothian cysts noted. Other: Per ultrasound sonographer, patient having contractions during exam. IMPRESSION: 1. Single live intrauterine pregnancy with a gestational age of [redacted] weeks and 5 days per ultrasound. 2. Cervical length 2.9 cm, funneled. 3. Per ultrasound sonographer, patient having contractions during exam. This exam is performed on an emergent basis and does not comprehensively evaluate fetal size, dating, or anatomy; follow-up complete OB US should be considered if further fetal assessment is warranted. Electronically Signed   By: Tish Frederickson M.D.   On: 09/04/2021 01:35    Current scheduled medications  docusate sodium  100 mg Oral BID   NIFEdipine  10 mg Oral Q6H    I have reviewed the patient's current medications.  ASSESSMENT: Patient Active Problem List   Diagnosis Date Noted   Preterm uterine contractions in third trimester, antepartum 08/31/2021   Preterm labor 08/31/2021   NSVD (normal spontaneous vaginal delivery) 05/02/2020   Postpartum hemorrhage 05/02/2020   Shoulder dystocia, delivered 05/02/2020   Pre-eclampsia 05/01/2020   Groin pain 03/24/2020    PLAN:  Continue routine antenatal care.  1. Preterm labor - Increase in UCs/hour - SVE 1/30/high, firm, posterior - s/p BMX and Mag sulfate - Terbutaline 1mg  given at 2311, pt refuses further doses - Procardia 10mg  q6hrs for tocolysis - Tylenol for pain management - Continue to observe   2. Fetal Well being  - Fetal Tracing: Cat I - GBS pos - Presentation: vtx confirmed by u/s    2312, CNM 09/04/2021 9:20 PM  Cyril Mourning Certified Nurse Midwife [facility]

## 2021-09-05 NOTE — Progress Notes (Signed)
Pt. found sound asleep with significant other in her bed. Upon waking pt., RN asked for a pain score 0-10, to which pt. replied "7," then fell back asleep. Ctx. occasional-rare at this time.

## 2021-09-05 NOTE — Discharge Planning (Signed)
Pt. discharged home. Pt. instructed to return to Labor & Delivery triage on Wednesday 11/23 at [facility] for a repeat NST and cervical check per Dr. Feliberto Gottron. Discharge paperwork provided as well as reasons to return to the hospital sooner; pt. verbalized understanding and left ambulatory with significant other.

## 2021-09-05 NOTE — Discharge Summary (Signed)
Carol Schmitt is a 20 y.o. female. She is at 101w1d gestation. No LMP recorded. Patient is pregnant. Estimated Date of Delivery: 11/13/21  Prenatal care site: [facility]    HPI: Carol Schmitt presents to L&D with complaints of painful uterine contractions  Factors complicating pregnancy: Preterm labor History of Preeclampsia Chlamydia positive 05/23/21 History of PPH Anemia in pregnancy  S: Resting comfortably. no CTX, no VB.no LOF,  Active fetal movement.   Maternal Medical History:  Past Medical Hx:  has a past medical history of Medical history non-contributory.    Past Surgical Hx:  has a past surgical history that includes No past surgeries.   Allergies  Allergen Reactions   [employer] Other (See Comments)    Mouth discomfort     Prior to Admission medications   Medication Sig Start Date End Date Taking? Authorizing Provider  Prenatal Vit-Fe Fumarate-FA (MULTIVITAMIN-PRENATAL) 27-0.8 MG TABS tablet Take 1 tablet by mouth daily at 12 noon.   Yes [provider]  EPINEPHrine (EPIPEN 2-PAK) 0.3 mg/0.3 mL IJ SOAJ injection Inject 0.3 mg into the muscle as needed for anaphylaxis. 05/20/21   Merwyn Katos, MD  ferrous sulfate 325 (65 FE) MG tablet Take 1 tablet (325 mg total) by mouth daily with breakfast. Take with Vitamin C 05/04/20 07/03/20  Christeen Douglas, MD  NIFEdipine (PROCARDIA-XL/NIFEDICAL-XL) 30 MG 24 hr tablet C Patient not taking: Reported on 08/31/2021 05/04/20   Christeen Douglas, MD    Social History: She  reports that she has never smoked. She has never used smokeless tobacco. She reports that she does not currently use alcohol. She reports that she does not currently use drugs after having used the following drugs: Marijuana.  Family History: family history is not on file. ,no history of gyn cancers  Review of Systems: A full review of systems was performed and negative except as noted in the HPI.    O:  BP (!) 91/49 (BP Location: Right Arm)    Pulse 69   Temp 98.7 F (37.1 C) (Oral)   Resp 15   SpO2 99%  Results for orders placed or performed during the hospital encounter of 09/03/21 (from the past 48 hour(s))  Wet prep, genital   Collection Time: 09/04/21  1:29 AM   Specimen: Urine, Clean Catch  Result Value Ref Range   Yeast Wet Prep HPF POC NONE SEEN NONE SEEN   Trich, Wet Prep NONE SEEN NONE SEEN   Clue Cells Wet Prep HPF POC NONE SEEN NONE SEEN   WBC, Wet Prep HPF POC FEW (A) <10   Sperm NONE SEEN   Urinalysis, Complete w Microscopic Urine, Clean Catch   Collection Time: 09/04/21  1:34 AM  Result Value Ref Range   Color, Urine YELLOW (A) YELLOW   APPearance CLEAR (A) CLEAR   Specific Gravity, Urine 1.021 1.005 - 1.030   pH 6.0 5.0 - 8.0   Glucose, UA NEGATIVE NEGATIVE mg/dL   Hgb urine dipstick NEGATIVE NEGATIVE   Bilirubin Urine NEGATIVE NEGATIVE   Ketones, ur 5 (A) NEGATIVE mg/dL   Protein, ur NEGATIVE NEGATIVE mg/dL   Nitrite NEGATIVE NEGATIVE   Leukocytes,Ua NEGATIVE NEGATIVE   RBC / HPF 0-5 0 - 5 RBC/hpf   WBC, UA 0-5 0 - 5 WBC/hpf   Bacteria, UA NONE SEEN NONE SEEN   Squamous Epithelial / LPF 0-5 0 - 5   Mucus PRESENT       Constitutional: NAD, AAOx3  HE/ENT: extraocular movements grossly intact, moist  mucous membranes CV: RRR PULM: nl respiratory effort, CTABL Abd: gravid, non-tender, non-distended, soft  Ext: Non-tender, Nonedmeatous Psych: mood appropriate, speech normal Pelvic : deferred SVE: Dilation: 1 Effacement (%): 30 Cervical Position: Posterior Station: Ballotable Exam by:: [place] CNM   Fetal Monitor: Baseline: 148 bpm Variability: moderate Accels: Present Decels: none Toco: irregular, every 30 minutes  Category: I   Assessment: 20 y.o. [redacted]w[redacted]d here for antenatal surveillance during pregnancy.  Principle diagnosis: Uterine contractions Diagnoses of Preterm labor and Encounter for antenatal screening for cervical length were pertinent to this visit.   Plan: Labor: not  present.  Cervix unchanged Fetal Wellbeing: Reassuring Cat 1 tracing. Reactive NST  D/c home stable, precautions reviewed, follow-up as scheduled.  F/u in 2 days for NST and Cervical check per TJS  ----- Chari Manning, CNM Certified Nurse Midwife [facility]

## 2021-09-07 ENCOUNTER — Observation Stay
Admission: EM | Admit: 2021-09-07 | Discharge: 2021-09-07 | Disposition: A | Discharge status: Home / Self Care | Payer: Managed Care, Other (non HMO) | Attending: Obstetrics and Gynecology | Admitting: Obstetrics and Gynecology

## 2021-09-07 ENCOUNTER — Other Ambulatory Visit: Payer: Self-pay

## 2021-09-07 ENCOUNTER — Encounter: Payer: Self-pay | Admitting: Obstetrics and Gynecology

## 2021-09-07 DIAGNOSIS — D509 Iron deficiency anemia, unspecified: Secondary | ICD-10-CM | POA: Diagnosis not present

## 2021-09-07 DIAGNOSIS — Z3689 Encounter for other specified antenatal screening: Secondary | ICD-10-CM | POA: Diagnosis not present

## 2021-09-07 DIAGNOSIS — O0993 Supervision of high risk pregnancy, unspecified, third trimester: Principal | ICD-10-CM | POA: Insufficient documentation

## 2021-09-07 DIAGNOSIS — O99013 Anemia complicating pregnancy, third trimester: Secondary | ICD-10-CM | POA: Diagnosis not present

## 2021-09-07 DIAGNOSIS — Z3A30 30 weeks gestation of pregnancy: Secondary | ICD-10-CM | POA: Insufficient documentation

## 2021-09-07 MED ORDER — DOCUSATE SODIUM 100 MG PO CAPS: 100.0000 mg | ORAL_CAPSULE | Freq: Every day | ORAL | Status: DC

## 2021-09-07 MED ORDER — PRENATAL MULTIVITAMIN CH: 1.0000 | ORAL_TABLET | Freq: Every day | ORAL | Status: DC

## 2021-09-07 MED ORDER — CALCIUM CARBONATE ANTACID 500 MG PO CHEW: 2.0000 | CHEWABLE_TABLET | ORAL | Status: DC | PRN

## 2021-09-07 MED ORDER — ACETAMINOPHEN 325 MG PO TABS: 650.0000 mg | ORAL_TABLET | ORAL | Status: DC | PRN

## 2021-09-07 MED ORDER — DOCUSATE SODIUM 100 MG PO CAPS: 100.0000 mg | ORAL_CAPSULE | Freq: Every day | ORAL | 0 refills | Status: DC

## 2021-09-07 NOTE — Progress Notes (Signed)
Patient discharged home, discharge instructions given, patient states understanding. Patient left floor in stable condition, denies any other needs at this time. Patient to keep next scheduled OB appointment 

## 2021-09-07 NOTE — Discharge Summary (Signed)
Carol Schmitt is a 20 y.o. female. She is at [redacted]w[redacted]d gestation. No LMP recorded. Patient is pregnant. Estimated Date of Delivery: 11/13/21  Prenatal care site: [facility]   Current pregnancy complicated by:  1. Chlamydia at NOB 05/2021 and again 08/10/21; Neg TOC 08/31/21 2. Hx shoulder dystocia and PPH w/ EBL 3. Iron deficiency anemia 4. 1hr GTT 135 5. 3cm with BBOW at 29wks, transferred to [facility], observed, and discharged.   Chief complaint: NST and cervical exam  S: Resting comfortably. Mild CTX, no VB.no LOF,  Active fetal movement.    Denies: HA, visual changes, SOB, or RUQ/epigastric pain  Maternal Medical History:   Past Medical History:  Diagnosis Date   Medical history non-contributory     Past Surgical History:  Procedure Laterality Date   NO PAST SURGERIES      Allergies  Allergen Reactions   Apple Other (See Comments)    Mouth discomfort    Prior to Admission medications   Medication Sig Start Date End Date Taking? Authorizing Provider  acetaminophen (TYLENOL) 325 MG tablet Take 2 tablets (650 mg total) by mouth every 4 (four) hours as needed (for pain scale < 4  OR  temperature  >/=  100.5 F). 09/07/21   Janyce Llanos, CNM  calcium carbonate (TUMS - DOSED IN MG ELEMENTAL CALCIUM) 500 MG chewable tablet Chew 2 tablets (400 mg of elemental calcium total) by mouth every 4 (four) hours as needed for indigestion. 09/07/21   Janyce Llanos, CNM  docusate sodium (COLACE) 100 MG capsule Take 1 capsule (100 mg total) by mouth daily. 09/07/21   Janyce Llanos, CNM  EPINEPHrine (EPIPEN 2-PAK) 0.3 mg/0.3 mL IJ SOAJ injection Inject 0.3 mg into the muscle as needed for anaphylaxis. 05/20/21   Merwyn Katos, MD  ferrous sulfate 325 (65 FE) MG tablet Take 1 tablet (325 mg total) by mouth daily with breakfast. Take with Vitamin C 05/04/20 07/03/20  Christeen Douglas, MD  NIFEdipine (PROCARDIA-XL/NIFEDICAL-XL) 30 MG 24 hr tablet C Patient  not taking: Reported on 08/31/2021 05/04/20   Christeen Douglas, MD  Prenatal Vit-Fe Fumarate-FA (MULTIVITAMIN-PRENATAL) 27-0.8 MG TABS tablet Take 1 tablet by mouth daily at 12 noon.    [provider]      Social History: She  reports that she has never smoked. She has never used smokeless tobacco. She reports that she does not currently use alcohol. She reports that she does not currently use drugs after having used the following drugs: Marijuana.  Family History: family history is not on file.  no history of gyn cancers  Review of Systems: A full review of systems was performed and negative except as noted in the HPI.     O:  BP 104/69 (BP Location: Left Arm)   Pulse 91   Temp 97.9 F (36.6 C) (Oral)   Resp 18  No results found for this or any previous visit (from the past 48 hour(s)).   Constitutional: NAD, AAOx3  HE/ENT: extraocular movements grossly intact, moist mucous membranes CV: RRR PULM: nl respiratory effort, CTABL     Abd: gravid, non-tender, non-distended, soft      Ext: Non-tender, Nonedematous   Psych: mood appropriate, speech normal Pelvic: 1/50/-2  Pelvic exam: normal external genitalia, vulva, vagina, cervix, uterus and adnexa.  Fetal  monitoring: Cat 1 Appropriate for GA Baseline: 145bpm Variability: moderate Accelerations: present x >2 Decelerations absent Contractions: 9-11 min, palpate mild Time  A/P: 20 y.o. [redacted]w[redacted]d  here for antenatal surveillance for preterm cervical dilation  Principle Diagnosis:  High risk pregnancy in third trimester  Labor: not present. Cervix unchanged Fetal Wellbeing: Reassuring Cat 1 tracing. Reactive NST  Keep prenatal visit in the office next week as scheduled.  D/c home stable, precautions reviewed, follow-up as scheduled.    Janyce Llanos, CNM 09/07/2021 1:25 PM

## 2021-09-07 NOTE — Progress Notes (Deleted)
Patient discharged home, discharge instructions given, patient states understanding. Patient left floor in stable condition, denies any other needs at this time. Patient to keep next scheduled OB appointment 

## 2021-09-07 NOTE — OB Triage Note (Signed)
Patient is here for a scheduled NST and cervical check.

## 2021-09-07 NOTE — OB Triage Note (Signed)
NST, patient reports a few ctx last night and she took a dose of Procardia and they stopped.

## 2021-09-07 NOTE — Discharge Instructions (Signed)
Keep your next scheduled appointment. Call your provider for any other concerns °

## 2021-09-08 NOTE — Discharge Summary (Addendum)
Discharge summary- transfer  History of Present Illness:  Chief Complaint: preterm contractions.   HPI:  Carol Schmitt is a 20 y.o. G2P1001 female at [redacted]w[redacted]d dated by [place] at [redacted]w[redacted]d; EDD 11/13/21.  She presents to L&D for increased BH contractions this evening, reports having had UCs off and on for last few weeks. Increased PO hydration today and rested but UCs continued. Feels menstrual like cramp and dull backache with abdominal tightness. Thinks she lost her mucus plug last week, denies LOF or VB. No recent IC and very active FM   Pregnancy Issues: 1. Chlamydia at NOB 05/2021 and again 08/10/21; Neg TOC 08/31/21 2. Hx shoulder dystocia and PPH w/ EBL 3. Iron deficiency anemia 4. 1hr GTT 135   Maternal Medical History:   Past Medical History:  Diagnosis Date   Medical history non-contributory     Past Surgical History:  Procedure Laterality Date   NO PAST SURGERIES      Allergies  Allergen Reactions   Apple Other (See Comments)    Mouth discomfort    Prior to Admission medications   Medication Sig Start Date End Date Taking? Authorizing Provider  Prenatal Vit-Fe Fumarate-FA (MULTIVITAMIN-PRENATAL) 27-0.8 MG TABS tablet Take 1 tablet by mouth daily at 12 noon.   Yes [provider]  EPINEPHrine (EPIPEN 2-PAK) 0.3 mg/0.3 mL IJ SOAJ injection Inject 0.3 mg into the muscle as needed for anaphylaxis. 05/20/21   Merwyn Katos, MD  ferrous sulfate 325 (65 FE) MG tablet Take 1 tablet (325 mg total) by mouth daily with breakfast. Take with Vitamin C 05/04/20 07/03/20  Christeen Douglas, MD  NIFEdipine (PROCARDIA-XL/NIFEDICAL-XL) 30 MG 24 hr tablet C Patient not taking: Reported on 08/31/2021 05/04/20   Christeen Douglas, MD     Prenatal care site: [facility]    Social History: She  reports that she has never smoked. She has never used smokeless tobacco. She reports that she does not currently use alcohol. She reports that she does not currently use drugs after  having used the following drugs: Marijuana.  Family History: no family hx Gyn cancers  Review of Systems: A full review of systems was performed and negative except as noted in the HPI.     Physical Exam:  Vital Signs: BP (!) 109/51 (BP Location: Left Arm)   Pulse (!) 123   Temp 98.3 F (36.8 C) (Oral)   Resp 20   Ht 5\' 2"  (1.575 m)   Wt 63.5 kg   SpO2 100%   BMI 25.61 kg/m   General: no acute distress.  HEENT: normocephalic, atraumatic Heart: regular rate & rhythm.  No murmurs/rubs/gallops Lungs: clear to auscultation bilaterally, normal respiratory effort Abdomen: soft, gravid, non-tender;  EFW: 3lbs Pelvic:   External: Normal external female genitalia  BBOW noted on speculum exam, mod amt yellow DC and cervical mucus. Mild erythema.   Extremities: non-tender, symmetric, no edema bilaterally.  DTRs: 2+  Neurologic: Alert & oriented x 3.    No results found for this or any previous visit (from the past 24 hour(s)).  Pertinent Results:  Prenatal Labs: Blood type/Rh B  pos  Antibody screen neg  Rubella Immune  Varicella Immune  RPR NR  HBsAg Neg  HIV NR  GC neg  Chlamydia neg  Genetic screening negative  1 hour GTT 135  3 hour GTT Not done  GBS pending   FHT: 140bpm, mod variability, + accels, no decels.  TOCO: q4-59min, palp mild to mod.  SVE:  3/75/-3, posterior, soft with BBOW.    Cephalic by bedside US   Assessment:  Carol Schmitt is a 20 y.o. G6P1001 female at [redacted]w[redacted]d with preterm labor.   Plan:  1. Admit to Labor & Delivery; consents reviewed and obtained - Preterm labor eval with BBOW noted on exam - Magnesium started for neuroprotection, 6gm load then 2gram per hour.  - Nifedipine PO given - initial BMZ given - GBS by PCR, wet prep and admit labs ordered.  - transfer of care due to prematurity. Care accepted by [facility], transferred in stable condition.   Randa Ngo, CNM 09/08/21 10:41 AM

## 2021-09-14 ENCOUNTER — Observation Stay
Admission: EM | Admit: 2021-09-14 | Discharge: 2021-09-14 | Disposition: A | Discharge status: Home / Self Care | Payer: Managed Care, Other (non HMO)

## 2021-09-14 DIAGNOSIS — Z3A31 31 weeks gestation of pregnancy: Secondary | ICD-10-CM | POA: Diagnosis not present

## 2021-09-14 DIAGNOSIS — O4700 False labor before 37 completed weeks of gestation, unspecified trimester: Secondary | ICD-10-CM | POA: Diagnosis present

## 2021-09-14 DIAGNOSIS — O4703 False labor before 37 completed weeks of gestation, third trimester: Principal | ICD-10-CM | POA: Insufficient documentation

## 2021-09-14 DIAGNOSIS — Z79899 Other long term (current) drug therapy: Secondary | ICD-10-CM | POA: Diagnosis not present

## 2021-09-14 LAB — WET PREP, GENITAL
Clue Cells Wet Prep HPF POC: NONE SEEN
Sperm: NONE SEEN
Trich, Wet Prep: NONE SEEN
WBC, Wet Prep HPF POC: <10 — AB (ref <10)
Yeast Wet Prep HPF POC: NONE SEEN

## 2021-09-14 LAB — URINALYSIS, COMPLETE (UACMP) WITH MICROSCOPIC
Bilirubin Urine: NEGATIVE
Glucose, UA: NEGATIVE mg/dL
Hgb urine dipstick: NEGATIVE
Leukocytes,Ua: NEGATIVE
Nitrite: NEGATIVE
Protein, ur: NEGATIVE mg/dL
Specific Gravity, Urine: 1.025 (ref 1.005–1.030)
pH: 7 (ref 5.0–8.0)

## 2021-09-14 LAB — CHLAMYDIA/NGC RT PCR (ARMC ONLY)
Chlamydia Tr: NOT DETECTED
N gonorrhoeae: NOT DETECTED

## 2021-09-14 MED ORDER — AZITHROMYCIN 500 MG PO TABS
1000.0000 mg | ORAL_TABLET | Freq: Once | ORAL | Status: AC
  Administered 2021-09-14: 1000 mg via ORAL
  Filled 2021-09-14: qty 2

## 2021-09-14 MED ORDER — ONDANSETRON 4 MG PO TBDP
4.0000 mg | ORAL_TABLET | Freq: Once | ORAL | Status: AC
  Administered 2021-09-14: 4 mg via ORAL
  Filled 2021-09-14: qty 1

## 2021-09-14 MED ORDER — METRONIDAZOLE 0.75 % VA GEL: 1.0000 | Freq: Every evening | VAGINAL | 0 refills | Status: DC

## 2021-09-14 MED ORDER — FLUCONAZOLE 150 MG PO TABS: 150.0000 mg | ORAL_TABLET | ORAL | 0 refills | Status: DC

## 2021-09-14 MED ORDER — ACETAMINOPHEN 500 MG PO TABS: 1000.0000 mg | ORAL_TABLET | Freq: Four times a day (QID) | ORAL | Status: DC | PRN

## 2021-09-14 MED ORDER — CALCIUM CARBONATE ANTACID 500 MG PO CHEW: 2.0000 | CHEWABLE_TABLET | ORAL | Status: DC | PRN

## 2021-09-14 NOTE — OB Triage Note (Signed)
Pt c/o ctx that started yesterday and reports mucus when she wipes when she goes to the bathroom.

## 2021-09-14 NOTE — Discharge Summary (Signed)
Carol Schmitt is a 20 y.o. female. She is at [redacted]w[redacted]d gestation. No LMP recorded. Patient is pregnant. Estimated Date of Delivery: 11/13/21  Prenatal care site: [facility]  Chief complaint: pelvic pressure and contractions    HPI: Carol Schmitt presents to L&D with complaints of incra   Factors complicating pregnancy: Chlamydia at NOB 05/2021 and again 08/10/21; Neg TOC 08/31/21 Hx shoulder dystocia and PPH w/ EBL Materna iron deficiency anemia 1hr GTT 135 Threatened preterm labor    S: Resting comfortably. no VB.no LOF, intermittent contractions - feels better after rest and hydration.  Active fetal movement.   Maternal Medical History:  Past Medical Hx:  has a past medical history of Medical history non-contributory.    Past Surgical Hx:  has a past surgical history that includes No past surgeries.   Allergies  Allergen Reactions   Apple Other (See Comments)    Mouth discomfort     Prior to Admission medications   Medication Sig Start Date End Date Taking? Authorizing Provider  fluconazole (DIFLUCAN) 150 MG tablet Take 1 tablet (150 mg total) by mouth once a week for 2 doses. 09/14/21 09/22/21 Yes Carol Schmitt, CNM  metroNIDAZOLE (METROGEL VAGINAL) 0.75 % vaginal gel Place 1 Applicatorful vaginally at bedtime for 5 days. 09/14/21 09/19/21 Yes Carol Schmitt, CNM  acetaminophen (TYLENOL) 325 MG tablet Take 2 tablets (650 mg total) by mouth every 4 (four) hours as needed (for pain scale < 4  OR  temperature  >/=  100.5 F). 09/07/21   Carol Schmitt, CNM  calcium carbonate (TUMS - DOSED IN MG ELEMENTAL CALCIUM) 500 MG chewable tablet Chew 2 tablets (400 mg of elemental calcium total) by mouth every 4 (four) hours as needed for indigestion. 09/07/21   Carol Schmitt, CNM  docusate sodium (COLACE) 100 MG capsule Take 1 capsule (100 mg total) by mouth daily. 09/07/21   Carol Schmitt, CNM  EPINEPHrine (EPIPEN 2-PAK) 0.3 mg/0.3 mL IJ SOAJ injection  Inject 0.3 mg into the muscle as needed for anaphylaxis. 05/20/21   Carol Katos, MD  ferrous sulfate 325 (65 FE) MG tablet Take 1 tablet (325 mg total) by mouth daily with breakfast. Take with Vitamin C 05/04/20 07/03/20  Carol Douglas, MD  NIFEdipine (PROCARDIA-XL/NIFEDICAL-XL) 30 MG 24 hr tablet C Patient not taking: Reported on 08/31/2021 05/04/20   Carol Douglas, MD  Prenatal Vit-Fe Fumarate-FA (MULTIVITAMIN-PRENATAL) 27-0.8 MG TABS tablet Take 1 tablet by mouth daily at 12 noon.    [provider]    Social History: She  reports that she has never smoked. She has never used smokeless tobacco. She reports that she does not currently use alcohol. She reports that she does not currently use drugs after having used the following drugs: Marijuana.  Family History: Family history non-contributory   Review of Systems: A full review of systems was performed and negative except as noted in the HPI.    O:  BP 95/74 (BP Location: Left Arm)   Pulse 82   Temp 98.4 F (36.9 C) (Oral)   Resp 18  Results for orders placed or performed during the hospital encounter of 09/14/21 (from the past 48 hour(s))  Wet prep, genital   Collection Time: 09/14/21  6:06 PM   Specimen: Cervical/Vaginal swab  Result Value Ref Range   Yeast Wet Prep HPF POC NONE SEEN NONE SEEN   Trich, Wet Prep NONE SEEN NONE SEEN   Clue Cells Wet Prep HPF POC NONE  SEEN NONE SEEN   WBC, Wet Prep HPF POC <10 (A) <10   Sperm NONE SEEN   Chlamydia/NGC rt PCR ([facility] only)   Collection Time: 09/14/21  6:06 PM   Specimen: Cervical/Vaginal swab; Genital  Result Value Ref Range   Specimen source GC/Chlam ENDOCERVICAL    Chlamydia Tr NOT DETECTED NOT DETECTED   N gonorrhoeae NOT DETECTED NOT DETECTED  Urinalysis, Complete w Microscopic Cervical/Vaginal swab   Collection Time: 09/14/21  6:06 PM  Result Value Ref Range   Color, Urine YELLOW YELLOW   APPearance CLEAR CLEAR   Specific Gravity, Urine 1.025 1.005 - 1.030    pH 7.0 5.0 - 8.0   Glucose, UA NEGATIVE NEGATIVE mg/dL   Hgb urine dipstick NEGATIVE NEGATIVE   Bilirubin Urine NEGATIVE NEGATIVE   Ketones, ur TRACE (A) NEGATIVE mg/dL   Protein, ur NEGATIVE NEGATIVE mg/dL   Nitrite NEGATIVE NEGATIVE   Leukocytes,Ua NEGATIVE NEGATIVE   Squamous Epithelial / LPF 0-5 0 - 5   WBC, UA 0-5 0 - 5 WBC/hpf   RBC / HPF 0-5 0 - 5 RBC/hpf   Bacteria, UA RARE (A) NONE SEEN   Mucus PRESENT       Constitutional: NAD, AAOx3  HE/ENT: extraocular movements grossly intact, moist mucous membranes CV: RRR PULM: nl respiratory effort, CTABL Abd: gravid, non-tender, non-distended, soft  Ext: Non-tender, Nonedmeatous Psych: mood appropriate, speech normal Pelvic :  moderate amount of yellow to green discharge, moderate erythema, + CMT SVE: Dilation: Fingertip Effacement (%): 50 Cervical Position: Posterior Station: -2 Presentation: Vertex Exam by:: Carol Schmitt,CNM    Cervix has been unchanged over the past several weeks   Fetal Monitor: Baseline: 145 bpm Variability: moderate Accels: Present Decels: none Toco: Irregular mild contractions   Category: I   Assessment: 20 y.o. [redacted]w[redacted]d here for antenatal surveillance during pregnancy.  Principle diagnosis: gf There were no encounter diagnoses.   Plan: Labor: not present.  Wet prep negative, GC/CT pending  UA unremarkable  Contractions feel less intense and less frequent after rest and hydration  Will treat for cervicitis and possible yeast infection based on symptoms and exam  Continue pelvic rest  Fetal Wellbeing: Reassuring Cat 1 tracing. Reactive NST  D/c home stable, precautions reviewed, follow-up as scheduled this Friday  ----- Carol Schmitt, CNM Certified Nurse Midwife [place]  [facility]

## 2021-09-14 NOTE — OB Triage Note (Signed)
Carol Schmitt is a 20 y.o. female. She is at [redacted]w[redacted]d gestation. No LMP recorded. Patient is pregnant. Estimated Date of Delivery: 11/13/21  Prenatal care site: [facility]  Chief complaint: pelvic pressure and contractions   HPI: Carol Schmitt presents to L&D with complaints of incra  Factors complicating pregnancy: Chlamydia at NOB 05/2021 and again 08/10/21; Neg TOC 08/31/21 Hx shoulder dystocia and PPH w/ EBL Materna iron deficiency anemia 1hr GTT 135 Threatened preterm labor   S: Resting comfortably. no VB.no LOF, intermittent contractions.  Active fetal movement.   Maternal Medical History:  Past Medical Hx:  has a past medical history of Medical history non-contributory.    Past Surgical Hx:  has a past surgical history that includes No past surgeries.   Allergies  Allergen Reactions   Apple Other (See Comments)    Mouth discomfort     Prior to Admission medications   Medication Sig Start Date End Date Taking? Authorizing Provider  acetaminophen (TYLENOL) 325 MG tablet Take 2 tablets (650 mg total) by mouth every 4 (four) hours as needed (for pain scale < 4  OR  temperature  >/=  100.5 F). 09/07/21   Janyce Llanos, CNM  calcium carbonate (TUMS - DOSED IN MG ELEMENTAL CALCIUM) 500 MG chewable tablet Chew 2 tablets (400 mg of elemental calcium total) by mouth every 4 (four) hours as needed for indigestion. 09/07/21   Janyce Llanos, CNM  docusate sodium (COLACE) 100 MG capsule Take 1 capsule (100 mg total) by mouth daily. 09/07/21   Janyce Llanos, CNM  EPINEPHrine (EPIPEN 2-PAK) 0.3 mg/0.3 mL IJ SOAJ injection Inject 0.3 mg into the muscle as needed for anaphylaxis. 05/20/21   Merwyn Katos, MD  ferrous sulfate 325 (65 FE) MG tablet Take 1 tablet (325 mg total) by mouth daily with breakfast. Take with Vitamin C 05/04/20 07/03/20  Christeen Douglas, MD  NIFEdipine (PROCARDIA-XL/NIFEDICAL-XL) 30 MG 24 hr tablet C Patient not taking: Reported  on 08/31/2021 05/04/20   Christeen Douglas, MD  Prenatal Vit-Fe Fumarate-FA (MULTIVITAMIN-PRENATAL) 27-0.8 MG TABS tablet Take 1 tablet by mouth daily at 12 noon.    [provider]    Social History: She  reports that she has never smoked. She has never used smokeless tobacco. She reports that she does not currently use alcohol. She reports that she does not currently use drugs after having used the following drugs: Marijuana.  Family History: Family history non-contributory   Review of Systems: A full review of systems was performed and negative except as noted in the HPI.    O:  BP 95/74 (BP Location: Left Arm)   Pulse 82   Temp 98.4 F (36.9 C) (Oral)   Resp 18  No results found for this or any previous visit (from the past 48 hour(s)).   Constitutional: NAD, AAOx3  HE/ENT: extraocular movements grossly intact, moist mucous membranes CV: RRR PULM: nl respiratory effort, CTABL Abd: gravid, non-tender, non-distended, soft  Ext: Non-tender, Nonedmeatous Psych: mood appropriate, speech normal Pelvic :  moderate amount of yellow to green discharge, moderate erythema, + CMT SVE: Dilation: Fingertip Effacement (%): 50 Cervical Position: Posterior Station: -2 Presentation: Vertex Exam by:: Shields Pautz,CNM   Fetal Monitor: Baseline: 145 bpm Variability: moderate Accels: Present Decels: none Toco: Irregular mild contractions   Category: I   Assessment: 20 y.o. [redacted]w[redacted]d here for antenatal surveillance during pregnancy.  Principle diagnosis: Threatened preterm labor     Plan: Wet prep, GC/CT, and UA  collected  PO hydrate Continuous EFM Fetal Wellbeing: Reassuring Cat 1 tracing, appropriate for gestational age  FFN positive on 08/31/2021 Close observation while labs are pending  Will consider IV hydration and Procardia 10mg   as tocolytic   ----- , CNM Certified Nurse Midwife [place]  [facility]

## 2021-09-14 NOTE — Progress Notes (Signed)
Patient given oral abx and zofran. Knows when to follow up or return to hospital and verbalized understanding.

## 2021-09-19 ENCOUNTER — Other Ambulatory Visit: Payer: Self-pay

## 2021-09-19 ENCOUNTER — Inpatient Hospital Stay: Payer: Managed Care, Other (non HMO) | Admitting: Anesthesiology

## 2021-09-19 ENCOUNTER — Inpatient Hospital Stay
Admission: EM | Admit: 2021-09-19 | Discharge: 2021-09-21 | DRG: 807 | Disposition: A | Discharge status: Home / Self Care | Payer: Managed Care, Other (non HMO) | Attending: Obstetrics | Admitting: Obstetrics

## 2021-09-19 ENCOUNTER — Encounter: Payer: Self-pay | Admitting: Obstetrics and Gynecology

## 2021-09-19 DIAGNOSIS — O42013 Preterm premature rupture of membranes, onset of labor within 24 hours of rupture, third trimester: Secondary | ICD-10-CM | POA: Diagnosis present

## 2021-09-19 DIAGNOSIS — D509 Iron deficiency anemia, unspecified: Secondary | ICD-10-CM | POA: Diagnosis present

## 2021-09-19 DIAGNOSIS — Z3A32 32 weeks gestation of pregnancy: Secondary | ICD-10-CM | POA: Diagnosis not present

## 2021-09-19 DIAGNOSIS — O4703 False labor before 37 completed weeks of gestation, third trimester: Secondary | ICD-10-CM | POA: Diagnosis present

## 2021-09-19 DIAGNOSIS — O9902 Anemia complicating childbirth: Secondary | ICD-10-CM | POA: Diagnosis present

## 2021-09-19 DIAGNOSIS — Z20822 Contact with and (suspected) exposure to covid-19: Secondary | ICD-10-CM | POA: Diagnosis present

## 2021-09-19 DIAGNOSIS — O99824 Streptococcus B carrier state complicating childbirth: Secondary | ICD-10-CM | POA: Diagnosis present

## 2021-09-19 LAB — CBC
HCT: 31.7 % — ABNORMAL LOW (ref 36.0–46.0)
Hemoglobin: 10.5 g/dL — ABNORMAL LOW (ref 12.0–15.0)
MCH: 29.5 pg (ref 26.0–34.0)
MCHC: 33.1 g/dL (ref 30.0–36.0)
MCV: 89 fL (ref 80.0–100.0)
Platelets: 159 K/uL (ref 150–400)
RBC: 3.56 MIL/uL — ABNORMAL LOW (ref 3.87–5.11)
RDW: 12.2 % (ref 11.5–15.5)
WBC: 7.7 K/uL (ref 4.0–10.5)
nRBC: 0 % (ref 0.0–0.2)

## 2021-09-19 LAB — RESP PANEL BY RT-PCR (FLU A&B, COVID) ARPGX2
Influenza A by PCR: NEGATIVE
Influenza B by PCR: NEGATIVE
SARS Coronavirus 2 by RT PCR: NEGATIVE

## 2021-09-19 LAB — TYPE AND SCREEN
ABO/RH(D): B POS
Antibody Screen: NEGATIVE

## 2021-09-19 LAB — CHLAMYDIA/NGC RT PCR (ARMC ONLY)
Chlamydia Tr: NOT DETECTED
N gonorrhoeae: NOT DETECTED

## 2021-09-19 MED ORDER — PHENYLEPHRINE 40 MCG/ML (10ML) SYRINGE FOR IV PUSH (FOR BLOOD PRESSURE SUPPORT)
80.0000 ug | PREFILLED_SYRINGE | INTRAVENOUS | Status: DC | PRN
  Filled 2021-09-19: qty 10

## 2021-09-19 MED ORDER — BENZOCAINE-MENTHOL 20-0.5 % EX AERO
1.0000 "application " | INHALATION_SPRAY | CUTANEOUS | Status: DC | PRN
  Administered 2021-09-19: 1 via TOPICAL
  Filled 2021-09-19: qty 56

## 2021-09-19 MED ORDER — OXYCODONE HCL 5 MG PO TABS: 5.0000 mg | ORAL_TABLET | ORAL | Status: DC | PRN

## 2021-09-19 MED ORDER — ACETAMINOPHEN 325 MG PO TABS: 650.0000 mg | ORAL_TABLET | ORAL | Status: DC | PRN

## 2021-09-19 MED ORDER — ONDANSETRON HCL 4 MG/2ML IJ SOLN
4.0000 mg | Freq: Four times a day (QID) | INTRAMUSCULAR | Status: DC | PRN
  Administered 2021-09-19: 4 mg via INTRAVENOUS
  Filled 2021-09-19: qty 2

## 2021-09-19 MED ORDER — LIDOCAINE HCL (PF) 1 % IJ SOLN
30.0000 mL | INTRAMUSCULAR | Status: DC | PRN
  Filled 2021-09-19: qty 30

## 2021-09-19 MED ORDER — LACTATED RINGERS IV SOLN: 500.0000 mL | INTRAVENOUS | Status: DC | PRN

## 2021-09-19 MED ORDER — COCONUT OIL OIL: 1.0000 "application " | TOPICAL_OIL | Status: DC | PRN

## 2021-09-19 MED ORDER — DIPHENHYDRAMINE HCL 25 MG PO CAPS: 25.0000 mg | ORAL_CAPSULE | Freq: Four times a day (QID) | ORAL | Status: DC | PRN

## 2021-09-19 MED ORDER — SOD CITRATE-CITRIC ACID 500-334 MG/5ML PO SOLN: 30.0000 mL | ORAL | Status: DC | PRN

## 2021-09-19 MED ORDER — METHYLERGONOVINE MALEATE 0.2 MG/ML IJ SOLN
INTRAMUSCULAR | Status: AC
  Filled 2021-09-19: qty 1

## 2021-09-19 MED ORDER — IBUPROFEN 600 MG PO TABS
600.0000 mg | ORAL_TABLET | Freq: Four times a day (QID) | ORAL | Status: DC
  Administered 2021-09-19 – 2021-09-21 (×6): 600 mg via ORAL
  Filled 2021-09-19 (×6): qty 1

## 2021-09-19 MED ORDER — SODIUM CHLORIDE 0.9 % IV SOLN: 250.0000 mL | INTRAVENOUS | Status: DC | PRN

## 2021-09-19 MED ORDER — DIBUCAINE (PERIANAL) 1 % EX OINT: 1.0000 "application " | TOPICAL_OINTMENT | CUTANEOUS | Status: DC | PRN

## 2021-09-19 MED ORDER — ONDANSETRON HCL 4 MG PO TABS: 4.0000 mg | ORAL_TABLET | ORAL | Status: DC | PRN

## 2021-09-19 MED ORDER — FENTANYL-BUPIVACAINE-NACL 0.5-0.125-0.9 MG/250ML-% EP SOLN
12.0000 mL/h | EPIDURAL | Status: DC | PRN
  Administered 2021-09-19: 12 mL/h via EPIDURAL

## 2021-09-19 MED ORDER — EPHEDRINE 5 MG/ML INJ
10.0000 mg | INTRAVENOUS | Status: DC | PRN
  Filled 2021-09-19: qty 2

## 2021-09-19 MED ORDER — LIDOCAINE-EPINEPHRINE (PF) 1.5 %-1:200000 IJ SOLN
INTRAMUSCULAR | Status: DC | PRN
  Administered 2021-09-19: 3 mL via PERINEURAL

## 2021-09-19 MED ORDER — CARBOPROST TROMETHAMINE 250 MCG/ML IM SOLN
INTRAMUSCULAR | Status: AC
  Filled 2021-09-19: qty 1

## 2021-09-19 MED ORDER — DIPHENHYDRAMINE HCL 50 MG/ML IJ SOLN: 12.5000 mg | INTRAMUSCULAR | Status: DC | PRN

## 2021-09-19 MED ORDER — BETAMETHASONE SOD PHOS & ACET 6 (3-3) MG/ML IJ SUSP
12.0000 mg | INTRAMUSCULAR | Status: DC
  Administered 2021-09-19: 12 mg via INTRAMUSCULAR
  Filled 2021-09-19: qty 5

## 2021-09-19 MED ORDER — LIDOCAINE HCL (PF) 1 % IJ SOLN
INTRAMUSCULAR | Status: DC | PRN
  Administered 2021-09-19: 3 mL

## 2021-09-19 MED ORDER — BUTORPHANOL TARTRATE 1 MG/ML IJ SOLN: 1.0000 mg | INTRAMUSCULAR | Status: DC | PRN

## 2021-09-19 MED ORDER — WITCH HAZEL-GLYCERIN EX PADS: 1.0000 "application " | MEDICATED_PAD | CUTANEOUS | Status: DC | PRN

## 2021-09-19 MED ORDER — SODIUM CHLORIDE 0.9% FLUSH: 3.0000 mL | Freq: Two times a day (BID) | INTRAVENOUS | Status: DC

## 2021-09-19 MED ORDER — SENNOSIDES-DOCUSATE SODIUM 8.6-50 MG PO TABS
2.0000 | ORAL_TABLET | ORAL | Status: DC
  Administered 2021-09-19 – 2021-09-20 (×2): 2 via ORAL
  Filled 2021-09-19 (×2): qty 2

## 2021-09-19 MED ORDER — BISACODYL 10 MG RE SUPP
10.0000 mg | Freq: Every day | RECTAL | Status: DC | PRN
  Administered 2021-09-20: 10 mg via RECTAL
  Filled 2021-09-19: qty 1

## 2021-09-19 MED ORDER — SODIUM CHLORIDE 0.9 % IV SOLN
5.0000 10*6.[IU] | Freq: Once | INTRAVENOUS | Status: AC
  Administered 2021-09-19: 5 10*6.[IU] via INTRAVENOUS
  Filled 2021-09-19: qty 5

## 2021-09-19 MED ORDER — SIMETHICONE 80 MG PO CHEW: 80.0000 mg | CHEWABLE_TABLET | ORAL | Status: DC | PRN

## 2021-09-19 MED ORDER — FLEET ENEMA 7-19 GM/118ML RE ENEM: 1.0000 | ENEMA | Freq: Every day | RECTAL | Status: DC | PRN

## 2021-09-19 MED ORDER — ONDANSETRON HCL 4 MG/2ML IJ SOLN: 4.0000 mg | INTRAMUSCULAR | Status: DC | PRN

## 2021-09-19 MED ORDER — LACTATED RINGERS IV SOLN
INTRAVENOUS | Status: DC
  Administered 2021-09-19: 1000 mL via INTRAVENOUS

## 2021-09-19 MED ORDER — BUPIVACAINE HCL (PF) 0.25 % IJ SOLN
INTRAMUSCULAR | Status: DC | PRN
  Administered 2021-09-19 (×2): 4 mL via EPIDURAL

## 2021-09-19 MED ORDER — PENICILLIN G POT IN DEXTROSE 60000 UNIT/ML IV SOLN
3.0000 10*6.[IU] | INTRAVENOUS | Status: DC
  Administered 2021-09-19: 3 10*6.[IU] via INTRAVENOUS
  Filled 2021-09-19 (×5): qty 50

## 2021-09-19 MED ORDER — ACETAMINOPHEN 325 MG PO TABS
650.0000 mg | ORAL_TABLET | ORAL | Status: DC | PRN
  Administered 2021-09-20: 650 mg via ORAL
  Filled 2021-09-19: qty 2

## 2021-09-19 MED ORDER — FENTANYL-BUPIVACAINE-NACL 0.5-0.125-0.9 MG/250ML-% EP SOLN
EPIDURAL | Status: AC
  Filled 2021-09-19: qty 250

## 2021-09-19 MED ORDER — DEXTROSE 5 % IV SOLN
1000.0000 mg | INTRAVENOUS | Status: DC
  Administered 2021-09-19: 1000 mg via INTRAVENOUS
  Filled 2021-09-19 (×2): qty 1000

## 2021-09-19 MED ORDER — OXYTOCIN BOLUS FROM INFUSION
333.0000 mL | Freq: Once | INTRAVENOUS | Status: AC
  Administered 2021-09-19: 333 mL via INTRAVENOUS

## 2021-09-19 MED ORDER — ZOLPIDEM TARTRATE 5 MG PO TABS: 5.0000 mg | ORAL_TABLET | Freq: Every evening | ORAL | Status: DC | PRN

## 2021-09-19 MED ORDER — OXYTOCIN-SODIUM CHLORIDE 30-0.9 UT/500ML-% IV SOLN
2.5000 [IU]/h | INTRAVENOUS | Status: DC
  Administered 2021-09-19 (×2): 2.5 [IU]/h via INTRAVENOUS
  Filled 2021-09-19 (×2): qty 500

## 2021-09-19 MED ORDER — LACTATED RINGERS IV SOLN
500.0000 mL | Freq: Once | INTRAVENOUS | Status: AC
  Administered 2021-09-19: 500 mL via INTRAVENOUS

## 2021-09-19 MED ORDER — TRANEXAMIC ACID-NACL 1000-0.7 MG/100ML-% IV SOLN
INTRAVENOUS | Status: AC
  Filled 2021-09-19: qty 100

## 2021-09-19 MED ORDER — PRENATAL MULTIVITAMIN CH
1.0000 | ORAL_TABLET | Freq: Every day | ORAL | Status: DC
  Administered 2021-09-20: 1 via ORAL
  Filled 2021-09-19: qty 1

## 2021-09-19 MED ORDER — SODIUM CHLORIDE 0.9% FLUSH: 3.0000 mL | INTRAVENOUS | Status: DC | PRN

## 2021-09-19 NOTE — Lactation Note (Signed)
This note was copied from a baby's chart. Lactation Consultation Note  Patient Name: Carol Schmitt [id number] Date: 09/19/2021 Reason for consult: L&D Initial assessment;NICU baby;Preterm <34wks;Infant < 6lbs Age:21 hours  Mom is P2, pre-term delivery at 32 weeks vaginally 1 hour ago. Mom did not BF her first- reports her mom assisted with pump set-up and it was painful so she did not continue with BF.  Baby is in SCN NPO at this time.  DEBP was set-up, education given for parts/pieces, use, cleaning and frequency of every 3 hours. Provided reassurance if she does not see output the first few times. Encouraged and taught hand expression pre/post pumping for optimal output.  Follow-up tomorrow.  Maternal Data Has patient been taught Hand Expression?: Yes Does the patient have breastfeeding experience prior to this delivery?: No (did not breastfeed her first)  Feeding Mother's Current Feeding Choice: Breast Milk and Formula  LATCH Score                    Lactation Tools Discussed/Used Tools: Pump Breast pump type: Double-Electric Breast Pump Pump Education: Setup, frequency, and cleaning;Milk Storage Reason for Pumping: 32wk; SCN Pumping frequency: q 3 hrs  Interventions Interventions: Education;DEBP;Hand express  Discharge Pump: Personal (Medela and insurance pump)  Consult Status Consult Status: Follow-up Date: 09/20/21 Follow-up type: In-patient    Danford Bad 09/19/2021, 5:09 PM

## 2021-09-19 NOTE — Progress Notes (Signed)
Labor Progress Note  Alara L Reader is a 20 y.o. G2P1001 at [redacted]w[redacted]d by ultrasound admitted for Preterm labor, SROM at 0400  Subjective: resting in bed with partner at bedside. Admits to intermittent painful uterine contractions  Objective: BP 115/70 (BP Location: Right Arm)   Pulse 83   Temp 98.4 F (36.9 C) (Oral)   Resp 16   Ht 5\' 2"  (1.575 m)   Wt 65.3 kg   BMI 26.34 kg/m  Notable VS details:   Fetal Assessment: FHT:  FHR: 145 bpm, variability: moderate,  accelerations:  Present,  decelerations:  Present intermittent small variables  with good return to baseline Category/reactivity:  Category II UC:   irregular, every 6-9 minutes SVE: 2-3/90/0 last checked by 04-28-1993 CNM  Membrane status: SROM x 4 hours Amniotic color: Clear  Labs: Lab Results  Component Value Date   WBC 7.7 09/19/2021   HGB 10.5 (L) 09/19/2021   HCT 31.7 (L) 09/19/2021   MCV 89.0 09/19/2021   PLT 159 09/19/2021    Assessment / Plan: Continue intrapartum expectant management with continuous fetal monitoring.  Labor:  expectant management,  continuous monitoring of FHT's , will re-evaluate if strip becomes non-reassuring.or significant cervical change. Fetal Wellbeing:  Category II Pain Control:   as desired by patient upon request I/D:   GBS positive, PCN given x 1 Anticipated MOD:  NSVD  Jaslynne Dahan LUCY LORENA Amiley Shishido, CNM 09/19/2021, 10:04 AM

## 2021-09-19 NOTE — Anesthesia Procedure Notes (Signed)
Epidural Patient location during procedure: OB Start time: 09/19/2021 2:02 PM End time: 09/19/2021 2:10 PM  Staffing Anesthesiologist: Reed Breech, MD Resident/CRNA: Karoline Caldwell, CRNA Performed: resident/CRNA   Preanesthetic Checklist Completed: patient identified, IV checked, site marked, risks and benefits discussed, surgical consent, monitors and equipment checked, pre-op evaluation and timeout performed  Epidural Patient position: sitting Prep: ChloraPrep Patient monitoring: heart rate, continuous pulse ox and blood pressure Approach: midline Location: L3-L4 Injection technique: LOR saline  Needle:  Needle type: Tuohy  Needle gauge: 17 G Needle length: 9 cm and 9 Needle insertion depth: 5 cm Catheter type: closed end flexible Catheter size: 19 Gauge Catheter at skin depth: 11 cm Test dose: negative and 1.5% lidocaine with Epi 1:200 K  Assessment Sensory level: T10 Events: blood not aspirated, injection not painful, no injection resistance, no paresthesia and negative IV test  Additional Notes 1 attempt Pt. Evaluated and documentation done after procedure finished. Patient identified. Risks/Benefits/Options discussed with patient including but not limited to bleeding, infection, nerve damage, paralysis, failed block, incomplete pain control, headache, blood pressure changes, nausea, vomiting, reactions to medication both or allergic, itching and postpartum back pain. Confirmed with bedside nurse the patient's most recent platelet count. Confirmed with patient that they are not currently taking any anticoagulation, have any bleeding history or any family history of bleeding disorders. Patient expressed understanding and wished to proceed. All questions were answered. Sterile technique was used throughout the entire procedure. Please see nursing notes for vital signs. Test dose was given through epidural catheter and negative prior to continuing to dose epidural or start  infusion. Warning signs of high block given to the patient including shortness of breath, tingling/numbness in hands, complete motor block, or any concerning symptoms with instructions to call for help. Patient was given instructions on fall risk and not to get out of bed. All questions and concerns addressed with instructions to call with any issues or inadequate analgesia.    Patient tolerated the insertion well without immediate complications.Reason for block:procedure for pain

## 2021-09-19 NOTE — Progress Notes (Signed)
At patient's bedside for labor evaluation. Patient has complaints of painful uterine contractions and requesting an epidural. Cervical check 4/90/0. IUPC placed at this time. Will continue to monitor FHT  category 2  with variable decels, moderate variability. CTX Q5-6 mins  F. Rubye Oaks CNM

## 2021-09-19 NOTE — H&P (Signed)
OB History & Physical   History of Present Illness:  Chief Complaint: preterm contractions and ruptured membranes this morning at 0400  HPI:  Carol Schmitt is a 20 y.o. G2P1001 female at [redacted]w[redacted]d dated by [place] at [redacted]w[redacted]d; EDD 11/13/21.  She presents to L&D for painful UCs and gush of clear fluid this am that started at 0400. Reports some bloody show, and active FM.    Pregnancy Issues: 1. Chlamydia at [facility] 05/2021 and again 08/10/21; Neg TOC 08/31/21 2. Hx shoulder dystocia and PPH w/ EBL 3. Iron deficiency anemia 4. 1hr GTT 135 5. Admitted and transferred to [facility] at 29wks with PTL, cervical dilation to 3cm, then stabilized and DC home 1-2cm 6. GBS Pos   Maternal Medical History:   Past Medical History:  Diagnosis Date   Medical history non-contributory     Past Surgical History:  Procedure Laterality Date   NO PAST SURGERIES      Allergies  Allergen Reactions   Apple Other (See Comments)    Mouth discomfort    Prior to Admission medications   Medication Sig Start Date End Date Taking? Authorizing Provider  Prenatal Vit-Fe Fumarate-FA (MULTIVITAMIN-PRENATAL) 27-0.8 MG TABS tablet Take 1 tablet by mouth daily at 12 noon.   Yes [provider]  EPINEPHrine (EPIPEN 2-PAK) 0.3 mg/0.3 mL IJ SOAJ injection Inject 0.3 mg into the muscle as needed for anaphylaxis. 05/20/21   Merwyn Katos, MD  ferrous sulfate 325 (65 FE) MG tablet Take 1 tablet (325 mg total) by mouth daily with breakfast. Take with Vitamin C 05/04/20 07/03/20  Christeen Douglas, MD  NIFEdipine (PROCARDIA-XL/NIFEDICAL-XL) 30 MG 24 hr tablet C Patient not taking: Reported on 08/31/2021 05/04/20   Christeen Douglas, MD     Prenatal care site: [facility]    Social History: She  reports that she has never smoked. She has never used smokeless tobacco. She reports that she does not currently use alcohol. She reports that she does not currently use drugs after having used the following drugs:  Marijuana.  Family History: no family hx Gyn cancers  Review of Systems: A full review of systems was performed and negative except as noted in the HPI.     Physical Exam:  Vital Signs: There were no vitals taken for this visit.  General: no acute distress.  HEENT: normocephalic, atraumatic Heart: regular rate & rhythm.  No murmurs/rubs/gallops Lungs: clear to auscultation bilaterally, normal respiratory effort Abdomen: soft, gravid, non-tender;  EFW: 3lbs Pelvic:   External: Normal external female genitalia  Copious amt clear fluid pooling on SSE.   SVE 2-3/90/0, soft/posterior  Extremities: non-tender, symmetric, no edema bilaterally.  DTRs: 2+  Neurologic: Alert & oriented x 3.    No results found for this or any previous visit (from the past 24 hour(s)).  Pertinent Results:  Prenatal Labs: Blood type/Rh B  pos  Antibody screen neg  Rubella Immune  Varicella Immune  RPR NR  HBsAg Neg  HIV NR  GC neg  Chlamydia neg  Genetic screening negative  1 hour GTT 135  3 hour GTT Not done  GBS POS 08/31/21   FHT: 150bpm, mod variability, + accels,  variables TOCO: q2-6 min, palp mod.    Cephalic by exam   Assessment:  Illene L Skibinski is a 20 y.o. G2P1001 female at [redacted]w[redacted]d with preterm labor.   Plan:  1. Admit to Labor & Delivery; consents reviewed and obtained - d/w Dr Valentino Saxon, pt admission, POC, will  give rescue course of BMZ - NICU notified of admission with expected delivery.   2. Fetal Well being  - Fetal Tracing: Cat II tracing with variable decels - Group B Streptococcus ppx indicated: POS, PCN ordered.  - Presentation: cephalic confirmed by bxam  3. Routine OB: - Prenatal labs reviewed, as above - Rh B Pos - CBC, T&S, RPR on admit - Clear fluids, IVF  4. Monitoring of Labor -  Contractions: external toco in place -  Pelvis proven to 2940grams -  Plan for continuous fetal monitoring   5. Post Partum Planning: - tdap given 08/31/21 - flu vaccine  declined  Randa Ngo, CNM 09/19/21 7:11 AM

## 2021-09-19 NOTE — Progress Notes (Signed)
Patient ID: Carol Schmitt, female   DOB: 05/09/2001, 20 y.o.   MRN: [id number] Noted read  PPROM 32+1 with cervical change . Variable decels , but good variability . Rescue steroids and GBS prophylaxis givem . Add Zithromax 1 gm   Continue expectant management unless advanced cervical change or non reassuring fetal monitoring

## 2021-09-19 NOTE — OB Triage Note (Signed)
Patient is a 20 yo, G2P1, at 32 weeks 1 days. Patient presents with complaints of LOF and ctx q 5 mins. Patient reports +FM. Bloody show present upon assessment. Monitors applied and assessing. VSS. Initial fetal heart tone 150. Will continue to monitor.

## 2021-09-19 NOTE — Discharge Summary (Signed)
Duplicate, discharge summary completed by another provider  Katharina Caper CNM

## 2021-09-19 NOTE — Anesthesia Preprocedure Evaluation (Signed)
Anesthesia Evaluation  Patient identified by MRN, date of birth, ID band Patient awake    Reviewed: Allergy & Precautions, NPO status , Patient's Chart, lab work & pertinent test results  Airway Mallampati: II       Dental no notable dental hx. (+) Teeth Intact   Pulmonary neg pulmonary ROS,           Cardiovascular negative cardio ROS       Neuro/Psych negative neurological ROS  negative psych ROS   GI/Hepatic Neg liver ROS, GERD  ,  Endo/Other  negative endocrine ROS  Renal/GU negative Renal ROS  negative genitourinary   Musculoskeletal   Abdominal   Peds  Hematology negative hematology ROS (+)   Anesthesia Other Findings   Reproductive/Obstetrics                             Anesthesia Physical Anesthesia Plan  ASA: 2  Anesthesia Plan: Epidural   Post-op Pain Management:    Induction:   PONV Risk Score and Plan:   Airway Management Planned:   Additional Equipment:   Intra-op Plan:   Post-operative Plan:   Informed Consent: I have reviewed the patients History and Physical, chart, labs and discussed the procedure including the risks, benefits and alternatives for the proposed anesthesia with the patient or authorized representative who has indicated his/her understanding and acceptance.       Plan Discussed with: Anesthesiologist and CRNA  Anesthesia Plan Comments:         Anesthesia Quick Evaluation

## 2021-09-20 LAB — RPR: RPR Ser Ql: NONREACTIVE

## 2021-09-20 LAB — CBC
HCT: 28.3 % — ABNORMAL LOW (ref 36.0–46.0)
Hemoglobin: 9.7 g/dL — ABNORMAL LOW (ref 12.0–15.0)
MCH: 30 pg (ref 26.0–34.0)
MCHC: 34.3 g/dL (ref 30.0–36.0)
MCV: 87.6 fL (ref 80.0–100.0)
Platelets: 152 10*3/uL (ref 150–400)
RBC: 3.23 MIL/uL — ABNORMAL LOW (ref 3.87–5.11)
RDW: 12.1 % (ref 11.5–15.5)
WBC: 15.5 10*3/uL — ABNORMAL HIGH (ref 4.0–10.5)
nRBC: 0 % (ref 0.0–0.2)

## 2021-09-20 NOTE — Lactation Note (Signed)
This note was copied from a baby's chart. Lactation Consultation Note  Patient Name: Carol Schmitt IOEVO'J Date: 09/20/2021   Age:21 hours  Lactation check in with mom in her MB room. Mom was drying pump parts and getting ready to pump again. Mom reports drops expressed overnight and baby's mouth was swabbed with her colostrum. Mom has been diligent on pumping every 3 hours, no pain or discomfort at this time. Mom has no questions today; reiterated LC number if anything comes up.    Danford Bad 09/20/2021, 11:10 AM

## 2021-09-20 NOTE — Anesthesia Postprocedure Evaluation (Signed)
Anesthesia Post Note  Patient: Carol Schmitt  Procedure(s) Performed: AN AD HOC LABOR EPIDURAL  Patient location during evaluation: Mother Baby Anesthesia Type: Epidural Level of consciousness: awake and alert Pain management: pain level controlled Vital Signs Assessment: post-procedure vital signs reviewed and stable Respiratory status: spontaneous breathing, nonlabored ventilation and respiratory function stable Cardiovascular status: stable Postop Assessment: no headache, no backache and epidural receding Anesthetic complications: no   No notable events documented.   Last Vitals:  Vitals:   09/20/21 0415 09/20/21 0812  BP: 112/67 95/62  Pulse: 74 68  Resp: 18 16  Temp: 36.8 C 37 C  SpO2: 100% 100%    Last Pain:  Vitals:   09/20/21 0812  TempSrc: Oral  PainSc:                  Rosanne Gutting

## 2021-09-20 NOTE — Progress Notes (Signed)
Postpartum Day  1  Subjective: no complaints, up ad lib, and voiding  Doing well, no concerns. Ambulating without difficulty, pain managed with PO meds, tolerating regular diet, and voiding without difficulty.   No fever/chills, chest pain, shortness of breath, nausea/vomiting, or leg pain. No nipple or breast pain. No headache, visual changes, or RUQ/epigastric pain.  Objective: BP 95/62 (BP Location: Left Arm)   Pulse 68   Temp 98.6 F (37 C) (Oral)   Resp 16   Ht 5\' 2"  (1.575 m)   Wt 65.3 kg   SpO2 100%   BMI 26.34 kg/m    Physical Exam:  General: alert, cooperative, and no distress Breasts: soft/nontender CV: RRR Pulm: nl effort, CTABL Abdomen: soft, non-tender, active bowel sounds Uterine Fundus: firm Perineum: minimal edema, intact Lochia: appropriate DVT Evaluation: No evidence of DVT seen on physical exam.  Recent Labs    09/19/21 0700 09/20/21 0616  HGB 10.5* 9.7*  HCT 31.7* 28.3*  WBC 7.7 15.5*  PLT 159 152    Assessment/Plan: 20 y.o. G2P1001 postpartum day # 1  -Continue routine postpartum care -Lactation consult PRN for breastfeeding  -Infant in SCN d/t prematurity  -Discussed contraceptive options including implant, IUDs hormonal and non-hormonal, injection, pills/ring/patch, condoms, and NFP. Desires Liletta IUD  -Maternal iron deficiency anemia - hemodynamically stable and asymptomatic; start PO ferrous sulfate BID with stool softeners  -Immunization status:   all immunizations up to date   Disposition: Continue inpatient postpartum care.  Unsure if she wants to go home today. Plan to stay for bonding with infant in SCN.  May d/c later today if desired.     LOS: 1 day   14/06/22, CNM 09/20/2021, 8:50 AM   ----- 14/03/2021  Certified Nurse Midwife [facility]

## 2021-09-21 NOTE — Discharge Summary (Signed)
Obstetrical Discharge Summary  Patient Name: Carol Schmitt DOB: 12/20/2000 MRN: [id number]  Date of Admission: 09/19/2021 Date of Delivery: 09/19/21 Delivered by: Corlis Leak Date of Discharge: 09/21/2021  Primary OB: [place]  LMP:No LMP recorded. Patient is pregnant. EDC Estimated Date of Delivery: 11/13/21 Gestational Age at Delivery: [redacted]w[redacted]d   Antepartum complications:  1. Chlamydia at [place] 05/2021 and again 08/10/21; Neg TOC 08/31/21 2. Hx shoulder dystocia and PPH w/ EBL 1781ml 3. Iron deficiency anemia 4. 1hr GTT 135 5. Admitted and transferred to [facility] at 29wks with PTL, cervical dilation to 3cm, then stabilized and DC home 1-2cm 6. GBS Pos Admitting Diagnosis: PTL Secondary Diagnosis: Patient Active Problem List   Diagnosis Date Noted   Preterm premature rupture of membranes (PPROM) with onset of labor within 24 hours of rupture in third trimester, antepartum 09/19/2021   Preterm contractions 09/14/2021   NST (non-stress test) reactive 09/07/2021   Preterm uterine contractions in third trimester, antepartum 08/31/2021   Preterm labor 08/31/2021   Encounter for supervision of other normal pregnancy, third trimester 05/23/2021   NSVD (normal spontaneous vaginal delivery) 05/02/2020   Postpartum hemorrhage 05/02/2020   Shoulder dystocia, delivered 05/02/2020   Pre-eclampsia 05/01/2020   Groin pain 03/24/2020    Augmentation: N/A Complications: None  Intrapartum complications/course:  Delivery Type: spontaneous vaginal delivery Anesthesia: epidural Placenta: spontaneous Laceration: none Episiotomy: none Newborn Data: Live born female  Birth Weight: 4 lb 3.4 oz (1910 g) APGAR: 8, 8  Newborn Delivery   Birth date/time: 09/19/2021 16:04:00 Delivery type: Vaginal, Spontaneous      20 y.o. G2P1001 at [redacted]w[redacted]d presenting with PTL, SROM with clear fluid.  She progressed to complete and pushed over an intact perineum and delivered the fetal head, followed  promptly by the shoulders. She was in control the whole time, and the baby placed on the maternal abdomen. The placenta delivered spontaneously and intact. No laceration noted. Mom and baby tolerated the procedure well.   Postpartum Procedures: none  Post partum course:  Patient had an uncomplicated postpartum course.  By time of discharge on PPD#2, her pain was controlled on oral pain medications; she had appropriate lochia and was ambulating, voiding without difficulty and tolerating regular diet.  She was deemed stable for discharge to home.    Discharge Physical Exam:  BP 106/73 (BP Location: Left Arm)   Pulse 62   Temp 98.1 F (36.7 C) (Oral)   Resp 18   Ht 5\' 2"  (1.575 m)   Wt 65.3 kg   SpO2 100%   BMI 26.34 kg/m   General: alert and no distress Pulm: normal respiratory effort Lochia: appropriate Abdomen: soft, NT Uterine Fundus: firm, below umbilicus Extremities: No evidence of DVT seen on physical exam. No lower extremity edema. Edinburgh:  Edinburgh Postnatal Depression Scale Screening Tool 09/20/2021 09/19/2021 05/03/2020 05/02/2020  I have been able to laugh and see the funny side of things. 0 (No Data) 0 (No Data)  I have looked forward with enjoyment to things. 0 - 0 -  I have blamed myself unnecessarily when things went wrong. 0 - 0 -  I have been anxious or worried for no good reason. 0 - 3 -  I have felt scared or panicky for no good reason. 0 - 0 -  Things have been getting on top of me. 1 - 1 -  I have been so unhappy that I have had difficulty sleeping. 0 - 0 -  I have felt sad or miserable.  0 - 0 -  I have been so unhappy that I have been crying. 0 - 0 -  The thought of harming myself has occurred to me. 0 - 0 -  Edinburgh Postnatal Depression Scale Total 1 - 4 -     Labs: CBC Latest Ref Rng & Units 09/20/2021 09/19/2021 08/31/2021  WBC 4.0 - 10.5 K/uL 15.5(H) 7.7 10.2  Hemoglobin 12.0 - 15.0 g/dL 1.6(X) 10.5(L) 11.3(L)  Hematocrit 36.0 - 46.0 % 28.3(L)  31.7(L) 31.8(L)  Platelets 150 - 400 K/uL 152 159 192   B POS Hemoglobin  Date Value Ref Range Status  09/20/2021 9.7 (L) 12.0 - 15.0 g/dL Final   HCT  Date Value Ref Range Status  09/20/2021 28.3 (L) 36.0 - 46.0 % Final    Disposition: stable, discharge to home Baby Feeding: pumping, baby in SCN Baby Disposition: staying in SCN  Contraception: IUD  Prenatal Labs:  Blood type/Rh B  pos  Antibody screen neg  Rubella Immune  Varicella Immune  RPR NR  HBsAg Neg  HIV NR  GC neg  Chlamydia neg  Genetic screening negative  1 hour GTT 135  3 hour GTT Not done  GBS POS   Rh Immune globulin given: n/a Rubella vaccine given: Immune Varicella vaccine given: Immune Tdap vaccine given in AP or PP setting: 08/31/21 Flu vaccine given in AP or PP setting: declined  Plan: Carol Schmitt was discharged to home in good condition. Follow-up appointment with delivering provider in 6 weeks.  Discharge Instructions: Per After Visit Summary. Activity: Advance as tolerated. Pelvic rest for 6 weeks.   Diet: Regular Discharge Medications: Allergies as of 09/21/2021       Reactions   Apple Other (See Comments)   Mouth discomfort        Medication List     STOP taking these medications    fluconazole 150 MG tablet Commonly known as: Diflucan   metroNIDAZOLE 0.75 % vaginal gel Commonly known as: METROGEL VAGINAL       TAKE these medications    acetaminophen 325 MG tablet Commonly known as: TYLENOL Take 2 tablets (650 mg total) by mouth every 4 (four) hours as needed (for pain scale < 4  OR  temperature  >/=  100.5 F).   calcium carbonate 500 MG chewable tablet Commonly known as: TUMS - dosed in mg elemental calcium Chew 2 tablets (400 mg of elemental calcium total) by mouth every 4 (four) hours as needed for indigestion.   docusate sodium 100 MG capsule Commonly known as: COLACE Take 1 capsule (100 mg total) by mouth daily.   EPINEPHrine 0.3 mg/0.3 mL Soaj  injection Commonly known as: EpiPen 2-Pak Inject 0.3 mg into the muscle as needed for anaphylaxis.   ferrous sulfate 325 (65 FE) MG tablet Take 1 tablet (325 mg total) by mouth daily with breakfast. Take with Vitamin C   multivitamin-prenatal 27-0.8 MG Tabs tablet Take 1 tablet by mouth daily at 12 noon.       Outpatient follow up:   Follow-up Information     Chari Manning Rolla Plate, CNM Follow up in 6 week(s).   Specialty: Obstetrics Why: PP Visit and IUD insert, Contact information: [address] [place] [address] [phone]                 Signed: Haroldine Laws, CNM 09/21/2021 8:28 AM

## 2021-09-21 NOTE — Progress Notes (Signed)
Pt discharged with infant in SCN. Discharge instructions, prescriptions, and follow up appointments given to and reviewed with patient. Pt verbalized understanding. Pt going to SCN to see infant, will ask for wheelchair when ready to leave hospital.

## 2022-02-14 ENCOUNTER — Emergency Department: Payer: Managed Care, Other (non HMO)

## 2022-02-14 ENCOUNTER — Emergency Department
Admission: EM | Admit: 2022-02-14 | Discharge: 2022-02-14 | Disposition: A | Discharge status: Home / Self Care | Payer: Managed Care, Other (non HMO) | Attending: Emergency Medicine | Admitting: Emergency Medicine

## 2022-02-14 DIAGNOSIS — R1084 Generalized abdominal pain: Secondary | ICD-10-CM | POA: Diagnosis not present

## 2022-02-14 DIAGNOSIS — R079 Chest pain, unspecified: Secondary | ICD-10-CM | POA: Diagnosis not present

## 2022-02-14 LAB — HEPATIC FUNCTION PANEL
ALT: 9 U/L (ref 0–44)
AST: 14 U/L — ABNORMAL LOW (ref 15–41)
Albumin: 4.1 g/dL (ref 3.5–5.0)
Alkaline Phosphatase: 43 U/L (ref 38–126)
Bilirubin, Direct: <0.1 mg/dL (ref 0.0–0.2)
Total Bilirubin: 0.7 mg/dL (ref 0.3–1.2)
Total Protein: 7.2 g/dL (ref 6.5–8.1)

## 2022-02-14 LAB — CBC
HCT: 37.1 % (ref 36.0–46.0)
Hemoglobin: 12.5 g/dL (ref 12.0–15.0)
MCH: 29.7 pg (ref 26.0–34.0)
MCHC: 33.7 g/dL (ref 30.0–36.0)
MCV: 88.1 fL (ref 80.0–100.0)
Platelets: 188 10*3/uL (ref 150–400)
RBC: 4.21 MIL/uL (ref 3.87–5.11)
RDW: 11.8 % (ref 11.5–15.5)
WBC: 4.4 10*3/uL (ref 4.0–10.5)
nRBC: 0 % (ref 0.0–0.2)

## 2022-02-14 LAB — BASIC METABOLIC PANEL
Anion gap: 7 (ref 5–15)
BUN: 11 mg/dL (ref 6–20)
CO2: 26 mmol/L (ref 22–32)
Calcium: 9.4 mg/dL (ref 8.9–10.3)
Chloride: 107 mmol/L (ref 98–111)
Creatinine, Ser: 0.62 mg/dL (ref 0.44–1.00)
GFR, Estimated: >60 mL/min (ref >60)
Glucose, Bld: 104 mg/dL — ABNORMAL HIGH (ref 70–99)
Potassium: 4.3 mmol/L (ref 3.5–5.1)
Sodium: 140 mmol/L (ref 135–145)

## 2022-02-14 LAB — TROPONIN I (HIGH SENSITIVITY): Troponin I (High Sensitivity): <2 ng/L (ref <18)

## 2022-02-14 LAB — POC URINE PREG, ED: Preg Test, Ur: NEGATIVE

## 2022-02-14 LAB — LIPASE, BLOOD: Lipase: 36 U/L (ref 11–51)

## 2022-02-14 MED ORDER — IBUPROFEN 400 MG PO TABS
400.0000 mg | ORAL_TABLET | Freq: Once | ORAL | Status: AC
  Administered 2022-02-14: 400 mg via ORAL
  Filled 2022-02-14: qty 1

## 2022-02-14 MED ORDER — ACETAMINOPHEN 500 MG PO TABS
1000.0000 mg | ORAL_TABLET | Freq: Once | ORAL | Status: AC
Start: 2022-02-14 — End: 2022-02-14
  Administered 2022-02-14: 1000 mg via ORAL
  Filled 2022-02-14: qty 2

## 2022-02-14 NOTE — ED Notes (Signed)
Pt to ED for CP that started around July 2021. Pt states her cp is R sided, gets worse when she moves. Denies SOB.  ?Pt also c/o dizziness that started around December, pt states it occurs around 2 times a day. Denies LOC and blurry vision.  ?

## 2022-02-14 NOTE — ED Provider Triage Note (Signed)
Emergency Medicine Provider Triage Evaluation Note ? ?Ridley Jerene Canny , a 21 y.o. female  was evaluated in triage.  Pt complains of right-sided chest pain.  Patient presents with chest pain this been ongoing times a year.  She states that has been worse over the past couple weeks but nothing changed today prompting her visit.  It is reproducible with palpation in the intercostal margin. ? ?Review of Systems  ?Positive: Right-sided chest pain ?Negative: Shortness of breath, pleuritic pain, GI symptoms, abdominal pain ? ?Physical Exam  ?Ht 5\' 2"  (1.575 m)   LMP 02/13/2022   BMI 26.34 kg/m?  ?Gen:   Awake, no distress   ?Resp:  Normal effort  ?MSK:   Moves extremities without difficulty  ?Other:   ? ?Medical Decision Making  ?Medically screening exam initiated at 6:10 PM.  Appropriate orders placed.  Joelene L Wofford was informed that the remainder of the evaluation will be completed by another provider, this initial triage assessment does not replace that evaluation, and the importance of remaining in the ED until their evaluation is complete. ? ?Patient with right-sided chest pain times a year.  Is reproducible with palpation.  Patient will have labs, EKG, chest x-ray at this time. ?  ?Dan Humphreys, PA-C ?02/14/22 1842 ? ?

## 2022-02-14 NOTE — ED Notes (Signed)
Pt A&O, pt given discharge instructions, pt ambulating with steady gait. 

## 2022-02-14 NOTE — ED Triage Notes (Signed)
Patient to ER via POV from home with complaints of chest pain under her right breast, reports initially the pain started approx 1 year ago after the birth of her son. Reports it has now worsened over the last several weeks. Pain radiates into arms. Pain worse at nighttime. Denies shortness of breath.  ?

## 2022-02-14 NOTE — ED Provider Notes (Signed)
? ?[facility] ?Provider Note ? ? ? Event Date/Time  ? First MD Initiated Contact with Patient 02/14/22 2012   ?  (approximate) ? ? ?History  ? ?Chest Pain ? ? ?HPI ? ?Carol Schmitt is a 21 y.o. female without significant past medical history presents for assessment of some acute on chronic right-sided chest pain.  Patient states she pulled a muscle around a year ago when she was caring around her young son and recently had another baby a couple months ago and is worried that she may have pulled a muscle the pain is a little lower under her right breast compared to when she last had pain in this area.  She does not recall any injuries.  It is aggravated by movement of her right arm and twisting.  No fevers, cough, shortness of breath, headache, earache, sore throat vomiting or diarrhea.  She states over the last month or so she has had some intermittent crampy generalized abdominal pain but not any pain today.  No urinary symptoms vaginal bleeding or discharge.  No back pain.  No rashes.  No injuries.  He has not taken any analgesia for symptoms.  Denies any EtOH use or NSAID use. ? ?  ?Past Medical History:  ?Diagnosis Date  ? Medical history non-contributory   ? ? ? ?Physical Exam  ?Triage Vital Signs: ?ED Triage Vitals  ?Enc Vitals Group  ?   BP 02/14/22 1811 126/83  ?   Pulse Rate 02/14/22 1811 70  ?   Resp 02/14/22 1811 16  ?   Temp 02/14/22 1811 98.1 ?F (36.7 ?C)  ?   Temp src --   ?   SpO2 02/14/22 1811 100 %  ?   Weight --   ?   Height 02/14/22 1810 5\' 2"  (1.575 m)  ?   Head Circumference --   ?   Peak Flow --   ?   Pain Score 02/14/22 1809 6  ?   Pain Loc --   ?   Pain Edu? --   ?   Excl. in [place]? --   ? ? ?Most recent vital signs: ?Vitals:  ? 02/14/22 1811  ?BP: 126/83  ?Pulse: 70  ?Resp: 16  ?Temp: 98.1 ?F (36.7 ?C)  ?SpO2: 100%  ? ? ?General: Awake, no distress.  ?CV:  Good peripheral perfusion.  2+ radial pulses.  No murmur. ?Resp:  Normal effort.  Bilaterally. ?Abd:  No  distention.  Soft throughout. ?Other:  Mild tenderness under the right breast and right costochondral joints without any overlying skin changes. ? ? ?ED Results / Procedures / Treatments  ?Labs ?(all labs ordered are listed, but only abnormal results are displayed) ?Labs Reviewed  ?BASIC METABOLIC PANEL - Abnormal; Notable for the following components:  ?    Result Value  ? Glucose, Bld 104 (*)   ? All other components within normal limits  ?HEPATIC FUNCTION PANEL - Abnormal; Notable for the following components:  ? AST 14 (*)   ? All other components within normal limits  ?CBC  ?LIPASE, BLOOD  ?POC URINE PREG, ED  ?TROPONIN I (HIGH SENSITIVITY)  ? ? ? ?EKG ? ?ECG is remarkable for sinus rhythm with a ventricular rate of 71, normal axis, unremarkable intervals without clear evidence of acute ischemia or significant arrhythmia. ? ? ?RADIOLOGY ?Chest reviewed by myself shows no focal consoidation, effusion, edema, pneumothorax or other clear acute thoracic process. I also reviewed radiology interpretation and agree with  findings described. ? ? ? ?PROCEDURES: ? ?Critical Care performed: No ? ?Procedures ? ? ? ?MEDICATIONS ORDERED IN ED: ?Medications  ?acetaminophen (TYLENOL) tablet 1,000 mg (1,000 mg Oral Given 02/14/22 2038)  ?ibuprofen (ADVIL) tablet 400 mg (400 mg Oral Given 02/14/22 2038)  ? ? ? ?IMPRESSION / MDM / ASSESSMENT AND PLAN / ED COURSE  ?I reviewed the triage vital signs and the nursing notes. ?             ?               ? ?Guarded patient's right-sided chest discomfort suspect MSK and possible muscle strain versus costochondritis.  She is PERC negative and I have low suspicion for PE. ? ?ECG is remarkable for sinus rhythm with a ventricular rate of 71, normal axis, unremarkable intervals without clear evidence of acute ischemia or significant arrhythmia.  Nonelevated troponin is not suggestive of myocarditis and overall left very low suspicion for acute cardiac ischemia.  Presentation is not suggestive of  a dissection.  CBC is unremarkable.  BMP without any significant lecture metabolic derangements. ? ?Unclear etiology of patient's abdominal discomfort.  She has no abdominal pain today or tenderness on exam.  Low suspicion at this time for an acute cholecystitis, appendicitis kidney stone or pyelonephritis.  Hepatic function panel and lipase are unremarkable.  I think patient stable for discharge with continued outpatient evaluation with regard to this.  Discharged in stable condition.  Strict and precautions advised and discussed. ? ? ?  ? ? ?FINAL CLINICAL IMPRESSION(S) / ED DIAGNOSES  ? ?Final diagnoses:  ?Chest pain, unspecified type  ?Generalized abdominal pain  ? ? ? ?Rx / DC Orders  ? ?ED Discharge Orders   ? ? None  ? ?  ? ? ? ?Note:  This document was prepared using Dragon voice recognition software and may include unintentional dictation errors. ?  ?Lucrezia Starch, MD ?02/14/22 2158 ? ?

## 2022-02-15 IMAGING — US US OB TRANSVAGINAL
2 series · 14 of 28 positions shown · non-contrast
Comparison: none

CLINICAL DATA: Cervical length.

EXAM:
LIMITED OBSTETRIC ULTRASOUND AND TRANSVAGINAL OBSTETRIC ULTRASOUND

[Series 1: us ob transvaginal · 43 acquisitions, 13 frames shown]
[im 2/43]
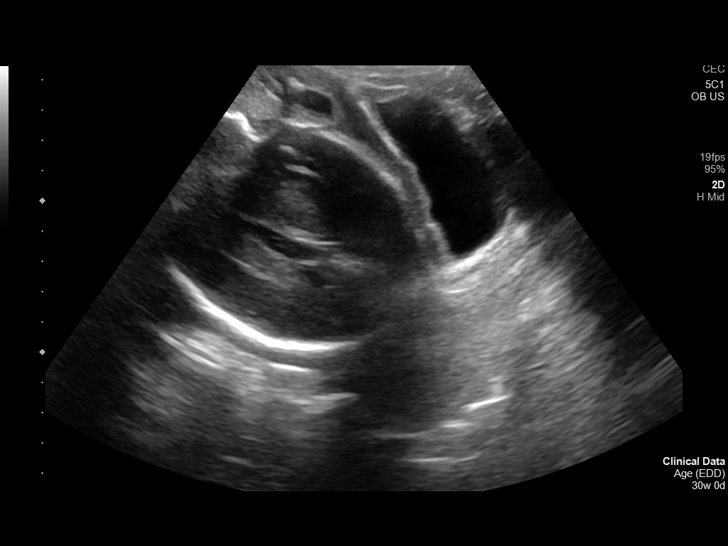
[im 5/43]
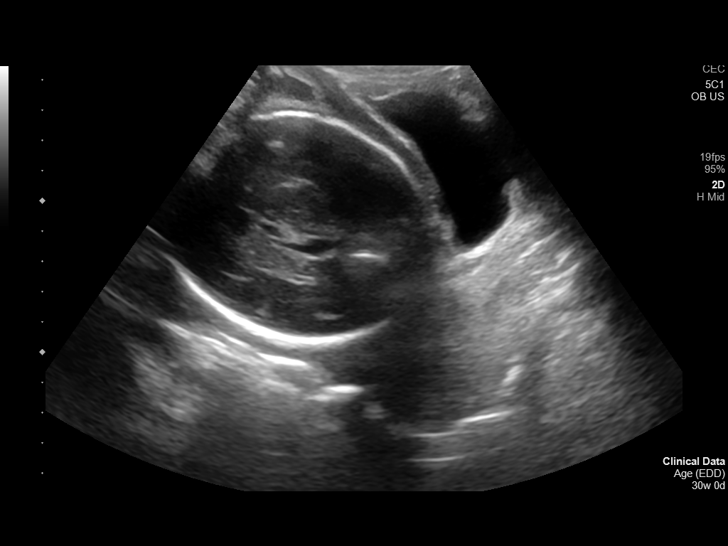
[im 9/43]
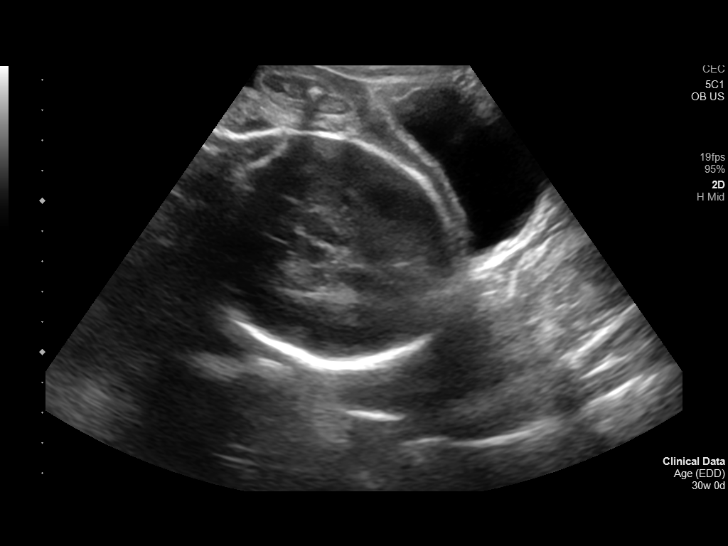
[im 12/43]
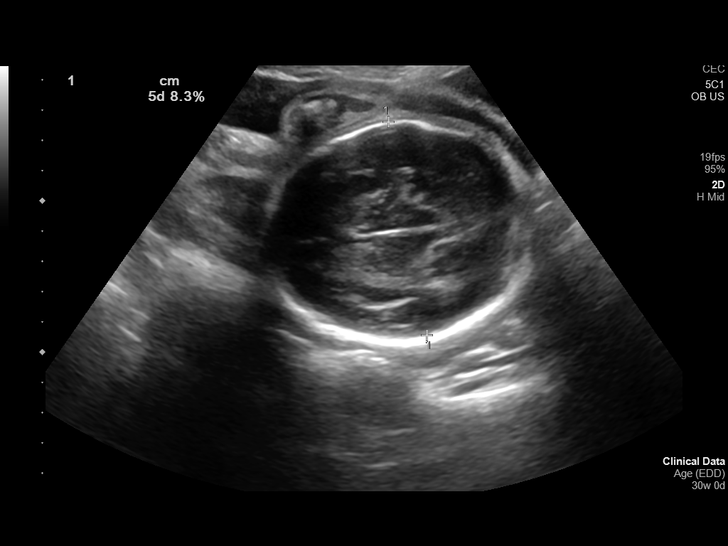
[im 15/43]
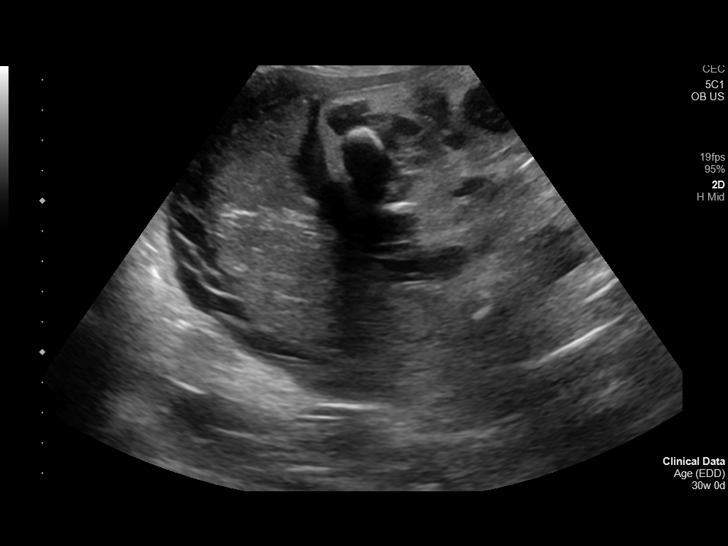
[im 18/43]
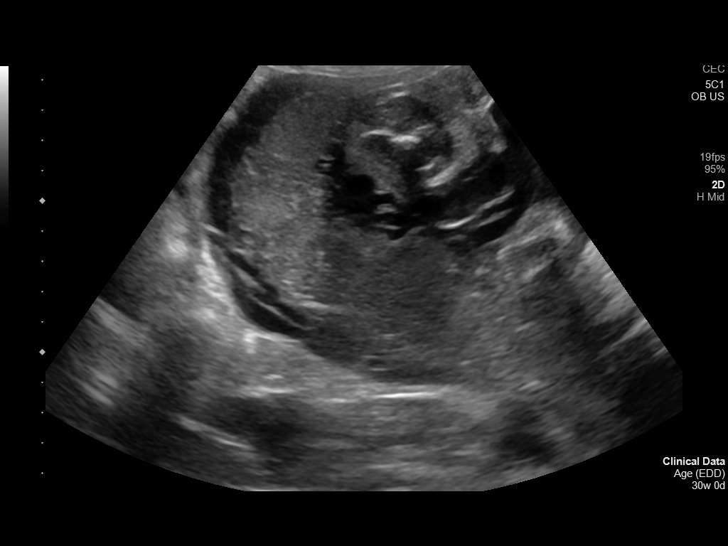
[im 22/43]
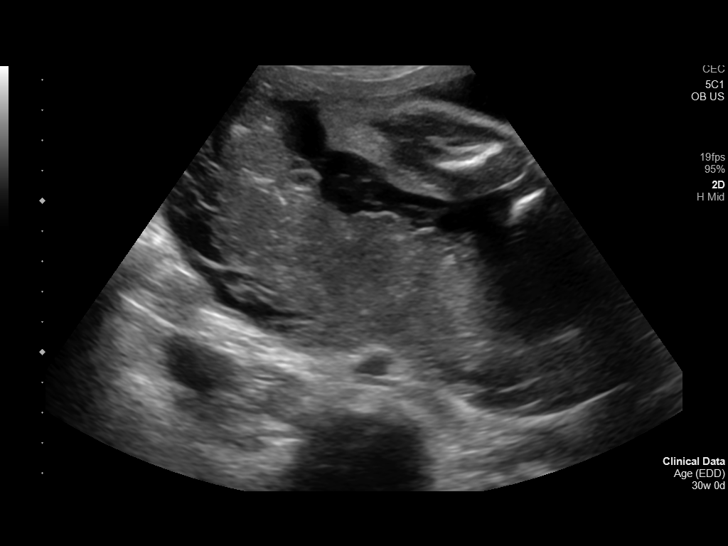
[im 25/43]
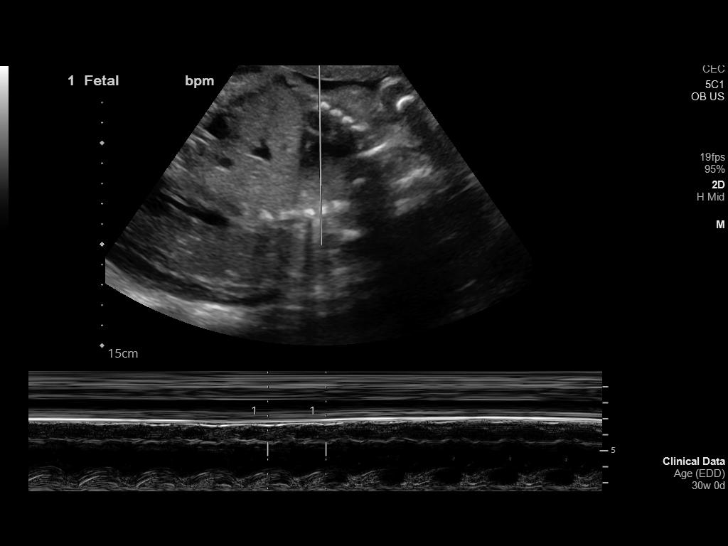
[im 28/43]
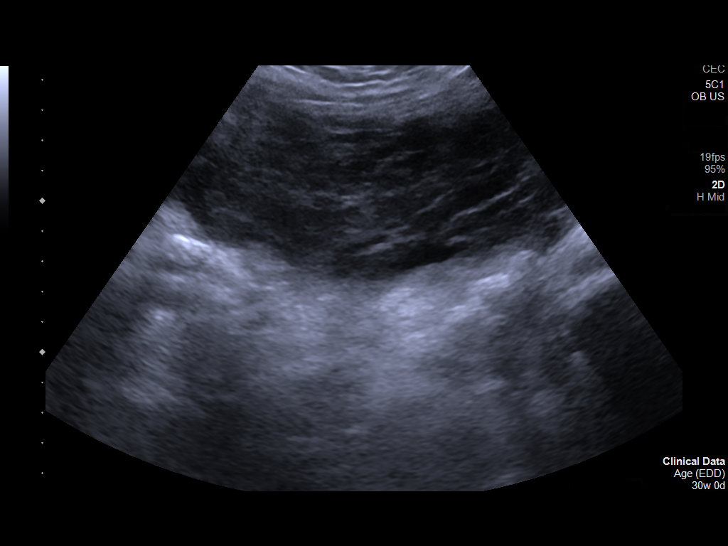
[im 31/43]
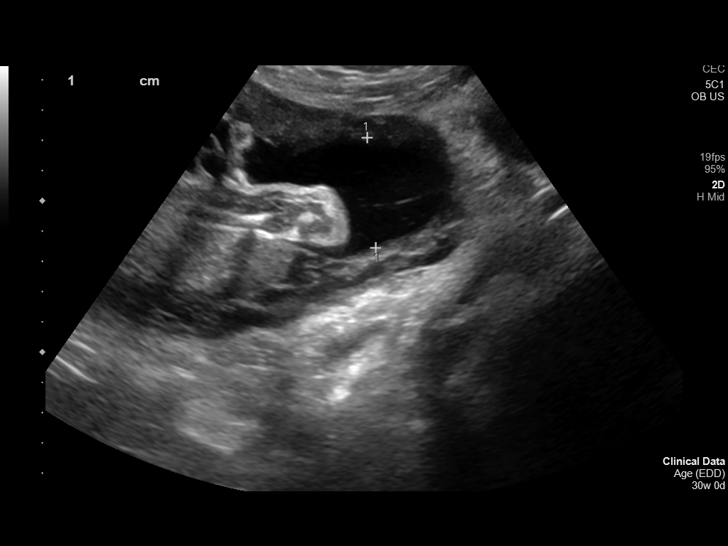
[im 34/43]
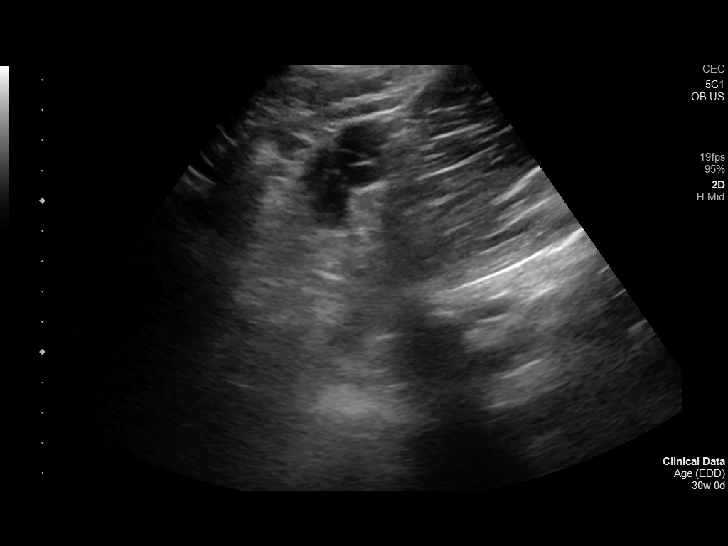
[im 38/43]
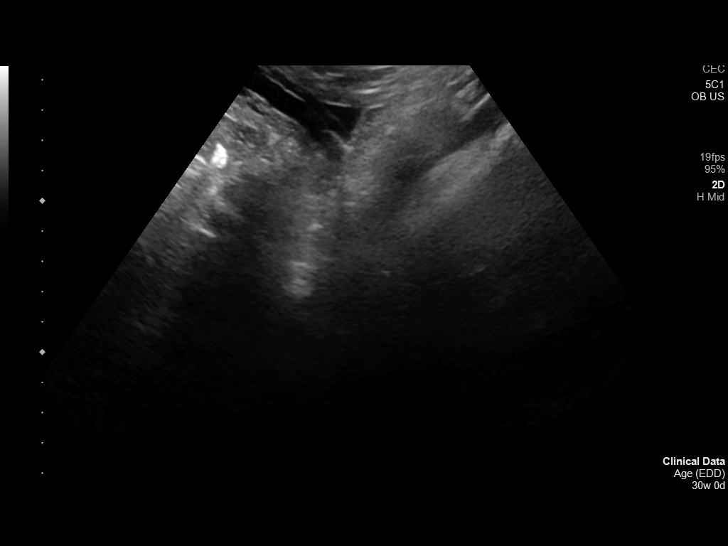
[im 41/43]
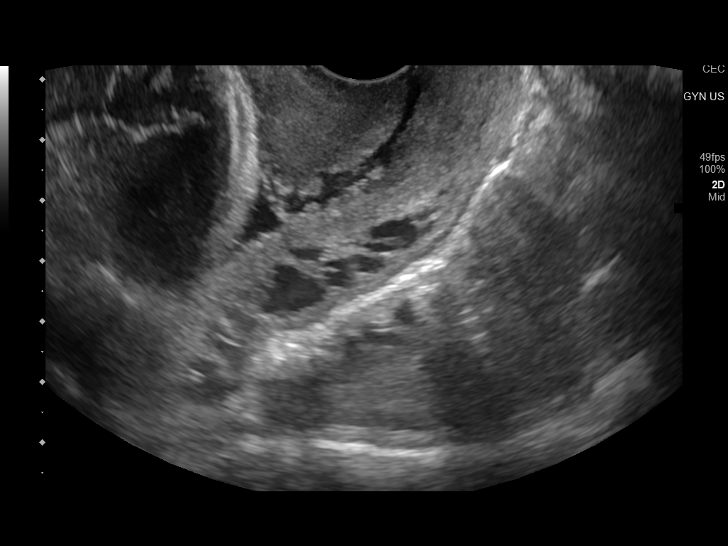

[Series 1001: early ob us · arterial · 1 of 1 slices shown]
[im 1/1]
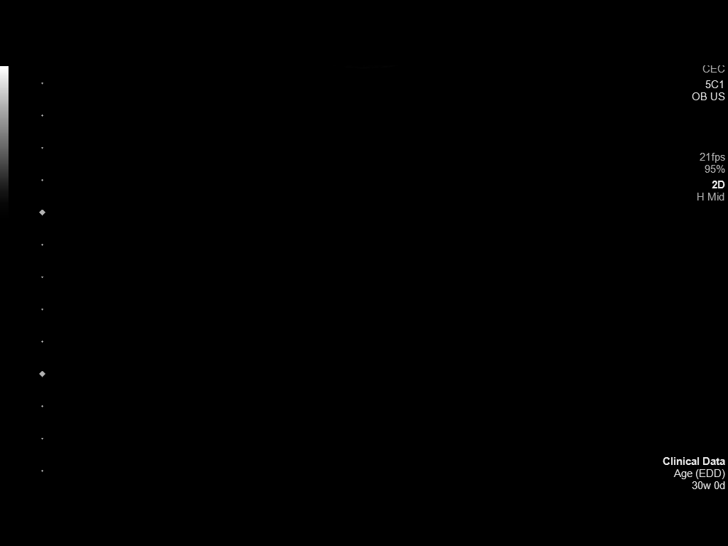

[14 of 28 positions shown; findings below may reference images not displayed]

FINDINGS: Number of Fetuses: 1

Heart Rate:  176 bpm

Movement: Yes

Presentation: Cephalic

Placental Location: Posterior, lateral right

Previa: No

Amniotic Fluid (Subjective): Within normal limits.

BPD:  7.2cm 28w 5d

MATERNAL FINDINGS:

Cervix: Funneled in opened by 1.1 cm.

Cervix length: 2.9 cm

Uterus/Adnexae:  No abnormality visualized.  Nabothian cysts noted.

Other: Per ultrasound sonographer, patient having contractions
during exam.
IMPRESSION: 1. Single live intrauterine pregnancy with a gestational age of 28
weeks and 5 days per ultrasound.
2. Cervical length 2.9 cm, funneled.
3. Per ultrasound sonographer, patient having contractions during
exam.

This exam is performed on an emergent basis and does not
comprehensively evaluate fetal size, dating, or anatomy; follow-up
complete OB US should be considered if further fetal assessment is
warranted.

## 2022-05-18 ENCOUNTER — Emergency Department
Admission: EM | Admit: 2022-05-18 | Discharge: 2022-05-18 | Disposition: A | Discharge status: Home / Self Care | Payer: Managed Care, Other (non HMO) | Attending: Emergency Medicine | Admitting: Emergency Medicine

## 2022-05-18 ENCOUNTER — Other Ambulatory Visit: Payer: Self-pay

## 2022-05-18 ENCOUNTER — Encounter: Payer: Self-pay | Admitting: Emergency Medicine

## 2022-05-18 DIAGNOSIS — B3731 Acute candidiasis of vulva and vagina: Secondary | ICD-10-CM | POA: Insufficient documentation

## 2022-05-18 LAB — URINALYSIS, ROUTINE W REFLEX MICROSCOPIC
Bacteria, UA: NONE SEEN
Bilirubin Urine: NEGATIVE
Glucose, UA: NEGATIVE mg/dL
Hgb urine dipstick: NEGATIVE
Ketones, ur: NEGATIVE mg/dL
Nitrite: NEGATIVE
Protein, ur: 30 mg/dL — AB
Specific Gravity, Urine: 1.026 (ref 1.005–1.030)
pH: 6 (ref 5.0–8.0)

## 2022-05-18 LAB — WET PREP, GENITAL
Clue Cells Wet Prep HPF POC: NONE SEEN
Sperm: NONE SEEN
Trich, Wet Prep: NONE SEEN
WBC, Wet Prep HPF POC: >=10 — AB (ref <10)

## 2022-05-18 LAB — COMPREHENSIVE METABOLIC PANEL
ALT: 11 U/L (ref 0–44)
AST: 14 U/L — ABNORMAL LOW (ref 15–41)
Albumin: 4.1 g/dL (ref 3.5–5.0)
Alkaline Phosphatase: 50 U/L (ref 38–126)
Anion gap: 5 (ref 5–15)
BUN: 13 mg/dL (ref 6–20)
CO2: 24 mmol/L (ref 22–32)
Calcium: 9.5 mg/dL (ref 8.9–10.3)
Chloride: 107 mmol/L (ref 98–111)
Creatinine, Ser: 0.75 mg/dL (ref 0.44–1.00)
GFR, Estimated: >60 mL/min (ref >60)
Glucose, Bld: 96 mg/dL (ref 70–99)
Potassium: 4 mmol/L (ref 3.5–5.1)
Sodium: 136 mmol/L (ref 135–145)
Total Bilirubin: 0.6 mg/dL (ref 0.3–1.2)
Total Protein: 7.7 g/dL (ref 6.5–8.1)

## 2022-05-18 LAB — CBC
HCT: 37.5 % (ref 36.0–46.0)
Hemoglobin: 12.3 g/dL (ref 12.0–15.0)
MCH: 30.1 pg (ref 26.0–34.0)
MCHC: 32.8 g/dL (ref 30.0–36.0)
MCV: 91.7 fL (ref 80.0–100.0)
Platelets: 189 10*3/uL (ref 150–400)
RBC: 4.09 MIL/uL (ref 3.87–5.11)
RDW: 12.3 % (ref 11.5–15.5)
WBC: 8.6 10*3/uL (ref 4.0–10.5)
nRBC: 0 % (ref 0.0–0.2)

## 2022-05-18 LAB — CHLAMYDIA/NGC RT PCR (ARMC ONLY)
Chlamydia Tr: NOT DETECTED
N gonorrhoeae: NOT DETECTED

## 2022-05-18 LAB — POC URINE PREG, ED: Preg Test, Ur: NEGATIVE

## 2022-05-18 LAB — LIPASE, BLOOD: Lipase: 40 U/L (ref 11–51)

## 2022-05-18 MED ORDER — CLOTRIMAZOLE 1 % VA CREA: 1.0000 | TOPICAL_CREAM | Freq: Every day | VAGINAL | 0 refills | Status: AC

## 2022-05-18 NOTE — ED Triage Notes (Signed)
Pt to ED from home c/o lower abd pain, pelvic pain, and dysuria for a couple days.  Burning with urination, frequency and vaginal discharge.

## 2022-05-18 NOTE — ED Provider Notes (Signed)
[facility] Provider Note    Event Date/Time   First MD Initiated Contact with Patient 05/18/22 0354     (approximate)   History   Dysuria and Abdominal Pain   HPI  Carol Schmitt is a 21 y.o. female who presents to the ED for evaluation of Dysuria and Abdominal Pain   Patient presents to the ED, accompanied by her toddler, for evaluation of 2 days of lower abdominal/pelvic cramping and vaginal discharge.  Reports taking a spare Diflucan that she had at home with improvement of her discharge, but the cramping remains.  She reports urogenital burning with urination, but reports it feels different than UTIs she has had past.  Reports having sexual intercourse 2 days ago prior to initiation of her symptoms, no known exposure to STDs.   Physical Exam   Triage Vital Signs: ED Triage Vitals [05/18/22 0146]  Enc Vitals Group     BP 112/80     Pulse Rate 100     Resp 16     Temp 98.8 F (37.1 C)     Temp Source Oral     SpO2 98 %     Weight 140 lb (63.5 kg)     Height 5\' 3"  (1.6 m)     Head Circumference      Peak Flow      Pain Score 7     Pain Loc      Pain Edu?      Excl. in GC?     Most recent vital signs: Vitals:   05/18/22 0146  BP: 112/80  Pulse: 100  Resp: 16  Temp: 98.8 F (37.1 C)  SpO2: 98%    General: Awake, no distress.  CV:  Good peripheral perfusion.  Resp:  Normal effort.  Abd:  No distention.  Soft and benign throughout.  Minimal suprapubic tenderness without guarding or peritoneal features. MSK:  No deformity noted.  Neuro:  No focal deficits appreciated. Other:  Chaperoned pelvic exam demonstrates mildly erythematous vulva and vagina with thick white cottage cheese white discharge throughout the vagina.  Cervix is closed.  No bleeding.   ED Results / Procedures / Treatments   Labs (all labs ordered are listed, but only abnormal results are displayed) Labs Reviewed  WET PREP, GENITAL - Abnormal; Notable for the  following components:      Result Value   Yeast Wet Prep HPF POC PRESENT (*)    WBC, Wet Prep HPF POC >=10 (*)    All other components within normal limits  COMPREHENSIVE METABOLIC PANEL - Abnormal; Notable for the following components:   AST 14 (*)    All other components within normal limits  URINALYSIS, ROUTINE W REFLEX MICROSCOPIC - Abnormal; Notable for the following components:   Color, Urine YELLOW (*)    APPearance HAZY (*)    Protein, ur 30 (*)    Leukocytes,Ua LARGE (*)    All other components within normal limits  CHLAMYDIA/NGC RT PCR ([facility] ONLY)            LIPASE, BLOOD  CBC  POC URINE PREG, ED    EKG   RADIOLOGY   Official radiology report(s): No results found.  PROCEDURES and INTERVENTIONS:  Procedures  Medications - No data to display   IMPRESSION / MDM / ASSESSMENT AND PLAN / ED COURSE  I reviewed the triage vital signs and the nursing notes.  Differential diagnosis includes, but is not limited to, yeast infection, BV,  PID, appendicitis, acute cystitis  {Patient presents with symptoms of an acute illness or injury that is potentially life-threatening.  Healthy 21 year old female presents to the ED with vaginal discharge and pelvic discomfort, with evidence of a vaginal yeast infection suitable for outpatient management.  She was systemically well and has reassuring abdominal examination.  Blood work is benign with normal CBC and metabolic panel.  Urine without infectious features to suggest acute cystitis.  Wet prep performed with evidence of yeast infection, which is clinically apparent as well with her thick white cottage cheese discharge.  No signs of PID or more severe illness.  Because she already took a Diflucan at home yesterday, we will add on topical clotrimazole and discharged with her precautions.  GC pending at time of discharge  Clinical Course as of 05/18/22 7342  [facility] May 18, 2022  0456 Pelvic performed and well tolerated [DS]    Clinical  Course User Index [DS] Delton Prairie, MD     FINAL CLINICAL IMPRESSION(S) / ED DIAGNOSES   Final diagnoses:  Yeast infection of the vagina     Rx / DC Orders   ED Discharge Orders          Ordered    clotrimazole (GYNE-LOTRIMIN) 1 % vaginal cream  Daily at bedtime        05/18/22 0557             Note:  This document was prepared using Dragon voice recognition software and may include unintentional dictation errors.   Delton Prairie, MD 05/18/22 [phone]

## 2022-05-22 ENCOUNTER — Other Ambulatory Visit: Payer: Self-pay

## 2022-05-22 ENCOUNTER — Emergency Department
Admission: EM | Admit: 2022-05-22 | Discharge: 2022-05-22 | Disposition: A | Discharge status: Home / Self Care | Payer: Self-pay | Attending: Emergency Medicine | Admitting: Emergency Medicine

## 2022-05-22 DIAGNOSIS — N898 Other specified noninflammatory disorders of vagina: Secondary | ICD-10-CM

## 2022-05-22 DIAGNOSIS — B379 Candidiasis, unspecified: Secondary | ICD-10-CM | POA: Insufficient documentation

## 2022-05-22 DIAGNOSIS — B3731 Acute candidiasis of vulva and vagina: Secondary | ICD-10-CM

## 2022-05-22 DIAGNOSIS — N760 Acute vaginitis: Secondary | ICD-10-CM | POA: Insufficient documentation

## 2022-05-22 LAB — WET PREP, GENITAL
Clue Cells Wet Prep HPF POC: NONE SEEN
Sperm: NONE SEEN
Trich, Wet Prep: NONE SEEN
WBC, Wet Prep HPF POC: <10 (ref <10)
Yeast Wet Prep HPF POC: NONE SEEN

## 2022-05-22 NOTE — ED Notes (Signed)
See triage note  Presents with some vaginal and cervix pressure  States she was seen several days ago for the same  Was told that she had a yeast infection

## 2022-05-22 NOTE — Discharge Instructions (Signed)
Continue with the vaginal cream prescribed by [facility] provider.  Follow-up with OB provider in 1 week if no improvement of your symptoms.

## 2022-05-22 NOTE — ED Provider Notes (Signed)
[facility] Emergency Department Provider Note     Event Date/Time   First MD Initiated Contact with Patient 05/22/22 1637     (approximate)   History   Vaginal Discharge   HPI  Carol Schmitt is a 21 y.o. female G2P1, presents to the ED for ongoing evaluation of cervical pressure.  Patient was evaluated 4 days prior for similar complaints, and found to have a bad yeast vaginitis.  Patient presents to the ED noting ongoing pelvic pressure.  She started on a previous prescription for Terazol 7 given by her OB provider, and lieu of the clotrimazole vaginal cream provided 4 days prior.  She needs a medication as directed.  She denies any fevers, chills, sweats.      Physical Exam   Triage Vital Signs: ED Triage Vitals  Enc Vitals Group     BP 05/22/22 1613 118/71     Pulse Rate 05/22/22 1613 71     Resp 05/22/22 1613 16     Temp 05/22/22 1613 98.9 F (37.2 C)     Temp Source 05/22/22 1613 Oral     SpO2 05/22/22 1613 98 %     Weight 05/22/22 1611 131 lb (59.4 kg)     Height 05/22/22 1611 5\' 3"  (1.6 m)     Head Circumference --      Peak Flow --      Pain Score 05/22/22 1611 5     Pain Loc --      Pain Edu? --      Excl. in GC? --     Most recent vital signs: Vitals:   05/22/22 1613  BP: 118/71  Pulse: 71  Resp: 16  Temp: 98.9 F (37.2 C)  SpO2: 98%    General Awake, no distress.  CV:  Good peripheral perfusion.  RESP:  Normal effort.  ABD:  No distention.  GYN:  Declined by patient. Wet prep collected by patient   ED Results / Procedures / Treatments   Labs (all labs ordered are listed, but only abnormal results are displayed) Labs Reviewed  WET PREP, GENITAL     EKG    RADIOLOGY   No results found.   PROCEDURES:  Critical Care performed: No  Procedures   MEDICATIONS ORDERED IN ED: Medications - No data to display   IMPRESSION / MDM / ASSESSMENT AND PLAN / ED COURSE  I reviewed the triage vital signs  and the nursing notes.                              Differential diagnosis includes, but is not limited to, yeast infection, BV, PID, appendicitis, acute cystitis, PID  Patient's presentation is most consistent with acute complicated illness / injury requiring diagnostic workup.  Patient's diagnosis is consistent with likely yeast vaginitis.  Patient declined a pelvic exam, or any additional testing given recent negative results.  Patient will be discharged home with directions to continue with her Terazol 7 vaginal cream. Patient is to follow up with her OB provider as needed or otherwise directed. Patient is given ED precautions to return to the ED for any worsening or new symptoms.   FINAL CLINICAL IMPRESSION(S) / ED DIAGNOSES   Final diagnoses:  Vaginal discharge  Yeast vaginitis     Rx / DC Orders   ED Discharge Orders     None        Note:  This document was prepared using Dragon voice recognition software and may include unintentional dictation errors.    Lissa Hoard, PA-C 05/22/22 1747    Sharyn Creamer, MD 05/23/22 [phone]

## 2022-05-22 NOTE — ED Triage Notes (Signed)
Pt states that she was seen here 4 days ago for complaints of pressure on her cervix- pt was told she had a bad yeast infection and was sent home- pt states that it got better after she was discharged but has come back- pt states she is concerned d/t the odor of her discharge

## 2022-06-18 ENCOUNTER — Emergency Department
Admission: EM | Admit: 2022-06-18 | Discharge: 2022-06-18 | Disposition: A | Discharge status: Home / Self Care | Payer: Self-pay | Attending: Emergency Medicine | Admitting: Emergency Medicine

## 2022-06-18 ENCOUNTER — Emergency Department: Payer: Self-pay

## 2022-06-18 ENCOUNTER — Other Ambulatory Visit: Payer: Self-pay

## 2022-06-18 ENCOUNTER — Encounter: Payer: Self-pay | Admitting: Emergency Medicine

## 2022-06-18 DIAGNOSIS — Z3A01 Less than 8 weeks gestation of pregnancy: Secondary | ICD-10-CM | POA: Insufficient documentation

## 2022-06-18 DIAGNOSIS — O4690 Antepartum hemorrhage, unspecified, unspecified trimester: Secondary | ICD-10-CM

## 2022-06-18 DIAGNOSIS — O368910 Maternal care for other specified fetal problems, first trimester, not applicable or unspecified: Secondary | ICD-10-CM | POA: Insufficient documentation

## 2022-06-18 DIAGNOSIS — O219 Vomiting of pregnancy, unspecified: Secondary | ICD-10-CM | POA: Insufficient documentation

## 2022-06-18 DIAGNOSIS — Z6720 Type B blood, Rh positive: Secondary | ICD-10-CM | POA: Insufficient documentation

## 2022-06-18 LAB — COMPREHENSIVE METABOLIC PANEL
ALT: 10 U/L (ref 0–44)
AST: 15 U/L (ref 15–41)
Albumin: 3.8 g/dL (ref 3.5–5.0)
Alkaline Phosphatase: 32 U/L — ABNORMAL LOW (ref 38–126)
Anion gap: 10 (ref 5–15)
BUN: 8 mg/dL (ref 6–20)
CO2: 24 mmol/L (ref 22–32)
Calcium: 8.9 mg/dL (ref 8.9–10.3)
Chloride: 100 mmol/L (ref 98–111)
Creatinine, Ser: 0.63 mg/dL (ref 0.44–1.00)
GFR, Estimated: >60 mL/min (ref >60)
Glucose, Bld: 93 mg/dL (ref 70–99)
Potassium: 3.5 mmol/L (ref 3.5–5.1)
Sodium: 134 mmol/L — ABNORMAL LOW (ref 135–145)
Total Bilirubin: 0.7 mg/dL (ref 0.3–1.2)
Total Protein: 7.6 g/dL (ref 6.5–8.1)

## 2022-06-18 LAB — CBC
HCT: 37.5 % (ref 36.0–46.0)
Hemoglobin: 12.5 g/dL (ref 12.0–15.0)
MCH: 30.3 pg (ref 26.0–34.0)
MCHC: 33.3 g/dL (ref 30.0–36.0)
MCV: 91 fL (ref 80.0–100.0)
Platelets: 172 10*3/uL (ref 150–400)
RBC: 4.12 MIL/uL (ref 3.87–5.11)
RDW: 11.9 % (ref 11.5–15.5)
WBC: 8 10*3/uL (ref 4.0–10.5)
nRBC: 0 % (ref 0.0–0.2)

## 2022-06-18 LAB — URINALYSIS, ROUTINE W REFLEX MICROSCOPIC
Bilirubin Urine: NEGATIVE
Glucose, UA: NEGATIVE mg/dL
Hgb urine dipstick: NEGATIVE
Ketones, ur: NEGATIVE mg/dL
Leukocytes,Ua: NEGATIVE
Nitrite: NEGATIVE
Protein, ur: 30 mg/dL — AB
Specific Gravity, Urine: 1.026 (ref 1.005–1.030)
pH: 7 (ref 5.0–8.0)

## 2022-06-18 LAB — POC URINE PREG, ED: Preg Test, Ur: POSITIVE — AB

## 2022-06-18 LAB — HCG, QUANTITATIVE, PREGNANCY: hCG, Beta Chain, Quant, S: 95906 m[IU]/mL — ABNORMAL HIGH (ref <5)

## 2022-06-18 MED ORDER — SODIUM CHLORIDE 0.9 % IV BOLUS
1000.0000 mL | Freq: Once | INTRAVENOUS | Status: AC
  Administered 2022-06-18: 1000 mL via INTRAVENOUS

## 2022-06-18 MED ORDER — ONDANSETRON 4 MG PO TBDP: 4.0000 mg | ORAL_TABLET | Freq: Three times a day (TID) | ORAL | 0 refills | Status: AC | PRN

## 2022-06-18 MED ORDER — ONDANSETRON HCL 4 MG/2ML IJ SOLN
4.0000 mg | Freq: Once | INTRAMUSCULAR | Status: AC
  Administered 2022-06-18: 4 mg via INTRAVENOUS
  Filled 2022-06-18: qty 2

## 2022-06-18 NOTE — ED Triage Notes (Signed)
Pt reports took a pregnancy test 4-5 days ago and it was positive. Pt states since then she has felt nauseated and been vomiting intermittently. Denies pain or other sx's. Pt states, "I was fine until I saw the test was positive".

## 2022-06-18 NOTE — ED Provider Notes (Signed)
[facility] Provider Note  Patient Contact: 5:16 PM (approximate)   History   Possible Pregnancy and Nausea   HPI  Carol Schmitt is a 21 y.o. female G3, P2, presents to the emergency department with vaginal bleeding/spotting that has occurred intermittently over the past 2 weeks and concern for possible pregnancy.  Patient states that she is also had some nausea and occasional vomiting.  She denies chest pain, chest tightness or shortness of breath.  She has been positive.  No current pelvic or abdominal pain.     Physical Exam   Triage Vital Signs: ED Triage Vitals  Enc Vitals Group     BP 06/18/22 1434 121/76     Pulse Rate 06/18/22 1434 81     Resp 06/18/22 1434 16     Temp 06/18/22 1434 98.3 F (36.8 C)     Temp Source 06/18/22 1434 Oral     SpO2 06/18/22 1434 100 %     Weight 06/18/22 1437 132 lb 4.4 oz (60 kg)     Height 06/18/22 1437 5\' 3"  (1.6 m)     Head Circumference --      Peak Flow --      Pain Score 06/18/22 1437 0     Pain Loc --      Pain Edu? --      Excl. in GC? --     Most recent vital signs: Vitals:   06/18/22 1434 06/18/22 1900  BP: 121/76 111/72  Pulse: 81 72  Resp: 16 16  Temp: 98.3 F (36.8 C) 98.2 F (36.8 C)  SpO2: 100% 100%     General: Alert and in no acute distress. Eyes:  PERRL. EOMI. Head: No acute traumatic findings ENT:      Nose: No congestion/rhinnorhea.      Mouth/Throat: Mucous membranes are moist. Neck: No stridor. No cervical spine tenderness to palpation. Cardiovascular:  Good peripheral perfusion Respiratory: Normal respiratory effort without tachypnea or retractions. Lungs CTAB. Good air entry to the bases with no decreased or absent breath sounds. Gastrointestinal: Bowel sounds 4 quadrants. Soft and nontender to palpation. No guarding or rigidity. No palpable masses. No distention. No CVA tenderness. Musculoskeletal: Full range of motion to all extremities.  Neurologic:  No gross  focal neurologic deficits are appreciated.  Skin:   No rash noted Other:   ED Results / Procedures / Treatments   Labs (all labs ordered are listed, but only abnormal results are displayed) Labs Reviewed  COMPREHENSIVE METABOLIC PANEL - Abnormal; Notable for the following components:      Result Value   Sodium 134 (*)    Alkaline Phosphatase 32 (*)    All other components within normal limits  URINALYSIS, ROUTINE W REFLEX MICROSCOPIC - Abnormal; Notable for the following components:   Color, Urine YELLOW (*)    APPearance CLOUDY (*)    Protein, ur 30 (*)    Bacteria, UA RARE (*)    All other components within normal limits  HCG, QUANTITATIVE, PREGNANCY - Abnormal; Notable for the following components:   hCG, Beta Chain, Quant, S 95,906 (*)    All other components within normal limits  POC URINE PREG, ED - Abnormal; Notable for the following components:   Preg Test, Ur POSITIVE (*)    All other components within normal limits  CBC        RADIOLOGY  I personally viewed and evaluated these images as part of my medical decision making, as well  as reviewing the written report by the radiologist.  ED Provider Interpretation: Dedicated OB ultrasound indicates small subchorionic hematoma with single live intrauterine pregnancy at 6 weeks 5 days.  Fetal heart rate 130 bpm.   PROCEDURES:  Critical Care performed: No  Procedures   MEDICATIONS ORDERED IN ED: Medications  sodium chloride 0.9 % bolus 1,000 mL (1,000 mLs Intravenous New Bag/Given 06/18/22 1716)  ondansetron (ZOFRAN) injection 4 mg (4 mg Intravenous Given 06/18/22 1716)     IMPRESSION / MDM / ASSESSMENT AND PLAN / ED COURSE  I reviewed the triage vital signs and the nursing notes.                              Assessment and plan:  Vaginal bleeding in pregnancy:  21 year old female G3, P2, presents to the emergency department with vaginal bleeding in pregnancy.    Vital signs were reassuring at triage.  On  exam, patient was alert and nontoxic-appearing.  CBC, CMP and urinalysis unremarkable.  Beta-hCG elevated at 95,906.  Given complaint of vaginal bleeding, will obtain dedicated OB ultrasound.  Patient is B postive.    Dedicated OB ultrasound indicates small subchorionic hematoma with single live intrauterine pregnancy at 6 weeks 5 days.  Fetal heart rate 130 bpm.  Radiologist remarks that there is a moderate amount of free fluid in the pelvis.  I reached out to OB/GYN on-call, Dr. Jean Rosenthal.  Very much appreciate time and consult.  Reviewed patient's benign abdominal exam with reassuring vitals, normal white blood cell count and other reassuring labs.  We both agreed that no additional work-up is warranted at this time given findings of moderate simple free fluid in the pelvis on dedicated ultrasound.  I stressed the importance of return to the emergency department if patient were to develop fever, worsening abdominal pain or vaginal bleeding.  Patient was referred to Dr. Jean Rosenthal as an outpatient.  FINAL CLINICAL IMPRESSION(S) / ED DIAGNOSES   Final diagnoses:  Vaginal bleeding in pregnancy     Rx / DC Orders   ED Discharge Orders          Ordered    ondansetron (ZOFRAN-ODT) 4 MG disintegrating tablet  Every 8 hours PRN        06/18/22 1948             Note:  This document was prepared using Dragon voice recognition software and may include unintentional dictation errors.   Pia Mau [place], PA-C 06/18/22 1949    Pilar Jarvis, MD 06/20/22 [phone]

## 2022-07-28 IMAGING — CR DG CHEST 2V
2 series · 2 of 2 positions shown · non-contrast
Comparison: March 31, 2021

CLINICAL DATA: Chest pain.

EXAM:
CHEST - 2 VIEW

[chest pa]
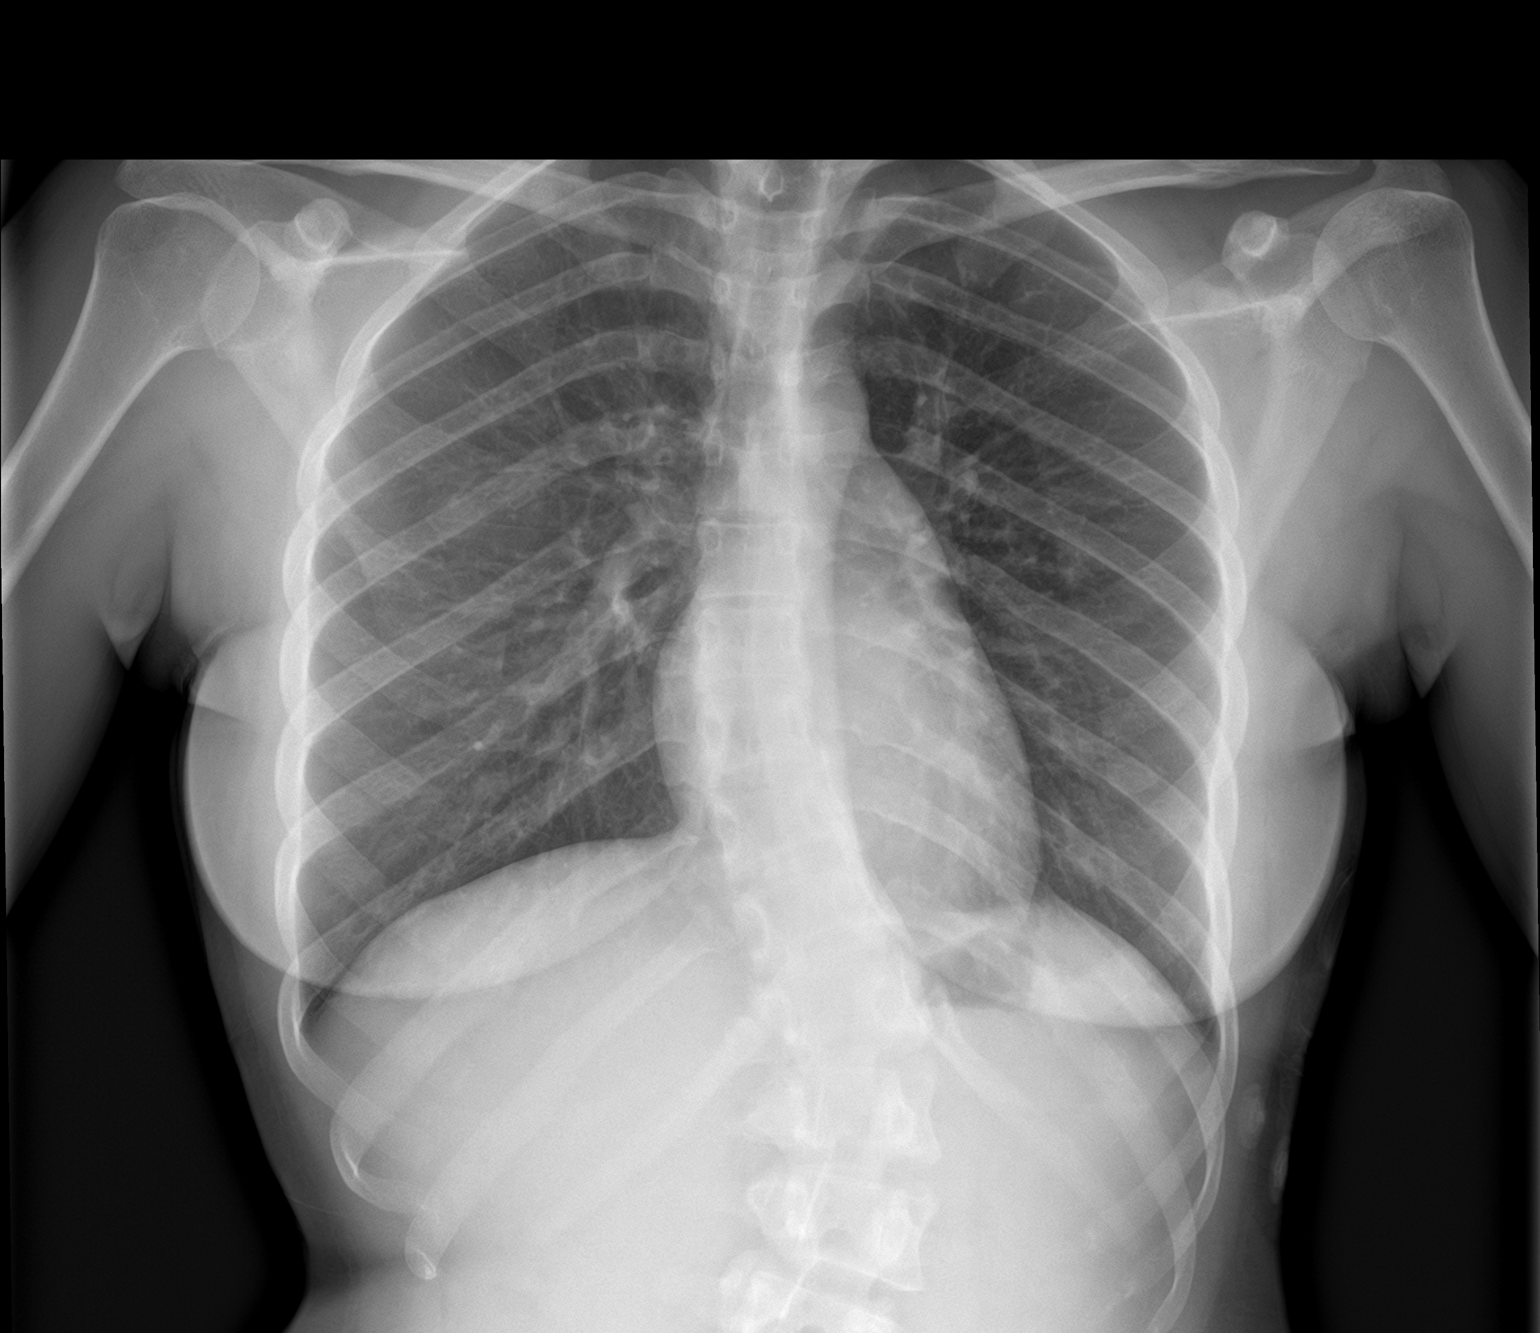

[chest lat]
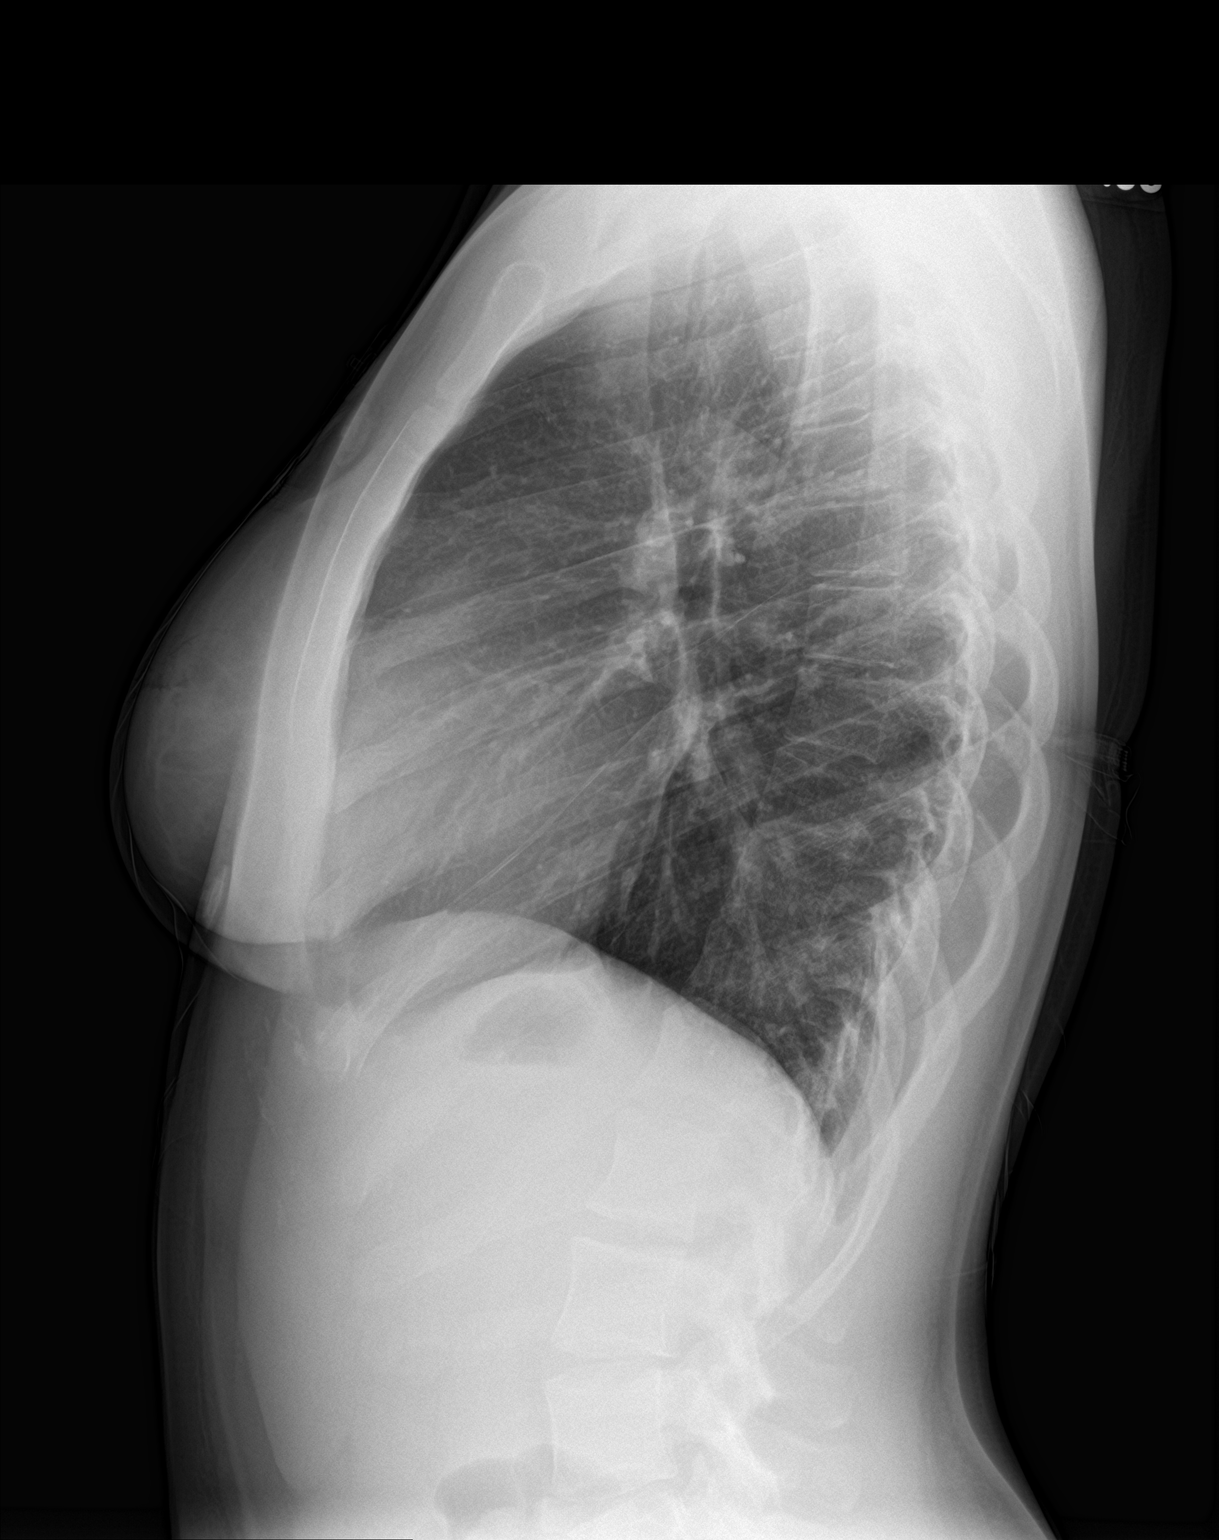

[2 of 2 positions shown; findings below may reference images not displayed]

FINDINGS: The heart size and mediastinal contours are within normal limits.
Both lungs are clear. There is stable scoliosis of the thoracolumbar
spine. No acute osseous abnormalities are identified.
IMPRESSION: No active cardiopulmonary disease.

## 2023-02-17 ENCOUNTER — Emergency Department
Admission: EM | Admit: 2023-02-17 | Discharge: 2023-02-17 | Disposition: A | Discharge status: Home / Self Care | Payer: 59 | Attending: Emergency Medicine | Admitting: Emergency Medicine

## 2023-02-17 ENCOUNTER — Other Ambulatory Visit: Payer: Self-pay

## 2023-02-17 DIAGNOSIS — N730 Acute parametritis and pelvic cellulitis: Secondary | ICD-10-CM | POA: Insufficient documentation

## 2023-02-17 DIAGNOSIS — N739 Female pelvic inflammatory disease, unspecified: Secondary | ICD-10-CM | POA: Diagnosis not present

## 2023-02-17 DIAGNOSIS — R102 Pelvic and perineal pain: Secondary | ICD-10-CM | POA: Diagnosis not present

## 2023-02-17 LAB — WET PREP, GENITAL
Clue Cells Wet Prep HPF POC: NONE SEEN
Sperm: NONE SEEN
Trich, Wet Prep: NONE SEEN
WBC, Wet Prep HPF POC: <10 (ref <10)
Yeast Wet Prep HPF POC: NONE SEEN

## 2023-02-17 LAB — URINALYSIS, ROUTINE W REFLEX MICROSCOPIC
Bilirubin Urine: NEGATIVE
Glucose, UA: NEGATIVE mg/dL
Hgb urine dipstick: NEGATIVE
Ketones, ur: NEGATIVE mg/dL
Leukocytes,Ua: NEGATIVE
Nitrite: NEGATIVE
Protein, ur: NEGATIVE mg/dL
Specific Gravity, Urine: 1.02 (ref 1.005–1.030)
pH: 6 (ref 5.0–8.0)

## 2023-02-17 LAB — CHLAMYDIA/NGC RT PCR (ARMC ONLY)
Chlamydia Tr: NOT DETECTED
N gonorrhoeae: NOT DETECTED

## 2023-02-17 LAB — POC URINE PREG, ED: Preg Test, Ur: NEGATIVE

## 2023-02-17 MED ORDER — CEFTRIAXONE SODIUM 1 G IJ SOLR
500.0000 mg | Freq: Once | INTRAMUSCULAR | Status: AC
  Administered 2023-02-17: 500 mg via INTRAMUSCULAR
  Filled 2023-02-17: qty 10

## 2023-02-17 MED ORDER — LIDOCAINE HCL (PF) 1 % IJ SOLN
INTRAMUSCULAR | Status: AC
  Filled 2023-02-17: qty 5

## 2023-02-17 MED ORDER — DOXYCYCLINE MONOHYDRATE 100 MG PO TABS: 100.0000 mg | ORAL_TABLET | Freq: Two times a day (BID) | ORAL | 0 refills | Status: AC

## 2023-02-17 NOTE — Discharge Instructions (Signed)
We are treating you for possible pelvic inflammatory disease but your test are still pending.  If This does confirm an STD then please follow up with your aprtner so they can get tested. Return to the ER if you develop fevers, worsening pain or any other concerns  No sex for 14 days and if your test are positive please notify partner  Follow-up with the health department for HIV, syphilis testing

## 2023-02-17 NOTE — ED Provider Notes (Signed)
[facility] Provider Note    Event Date/Time   First MD Initiated Contact with Patient 02/17/23 1359     (approximate)   History   Abdominal Pain   HPI  Carol Schmitt is a 22 y.o. female with 2 prior pregnancies who comes in with concerns for vaginal pelvic pressure.  Patient reports that over the past week and a half she has had some pelvic pressure where she stands up and she feels some discomfort.  She denies any significant pain at this time.  She states that she has not felt any bulges coming out of her.  She reports that she has had a new sexual partner she has had some discharge but she thinks that is her normal discharge.  She denies any fevers or other concerns.  Denies any history of cyst.   Physical Exam   Triage Vital Signs: ED Triage Vitals  Enc Vitals Group     BP 02/17/23 1317 109/77     Pulse Rate 02/17/23 1317 77     Resp 02/17/23 1317 16     Temp 02/17/23 1317 97.9 F (36.6 C)     Temp Source 02/17/23 1317 Oral     SpO2 02/17/23 1317 100 %     Weight 02/17/23 1313 130 lb (59 kg)     Height 02/17/23 1313 5' (1.524 m)     Head Circumference --      Peak Flow --      Pain Score 02/17/23 1313 2     Pain Loc --      Pain Edu? --      Excl. in GC? --     Most recent vital signs: Vitals:   02/17/23 1317  BP: 109/77  Pulse: 77  Resp: 16  Temp: 97.9 F (36.6 C)  SpO2: 100%     General: Awake, no distress.  CV:  Good peripheral perfusion.  Resp:  Normal effort.  Abd:  No distention.  Soft nontender Other:  No prolapse noted.  No swelling of the labia.  She has got white discharge noted without any cervical motion tenderness or adnexal tenderness   ED Results / Procedures / Treatments   Labs (all labs ordered are listed, but only abnormal results are displayed) Labs Reviewed  URINALYSIS, ROUTINE W REFLEX MICROSCOPIC - Abnormal; Notable for the following components:      Result Value   Color, Urine YELLOW (*)     APPearance HAZY (*)    All other components within normal limits  WET PREP, GENITAL  CHLAMYDIA/NGC RT PCR ([facility] ONLY)            POC URINE PREG, ED     PROCEDURES:  Critical Care performed: No  Procedures   MEDICATIONS ORDERED IN ED: Medications  cefTRIAXone (ROCEPHIN) injection 500 mg (500 mg Intramuscular Given 02/17/23 1445)  lidocaine (PF) (XYLOCAINE) 1 % injection (  Given 02/17/23 1449)     IMPRESSION / MDM / ASSESSMENT AND PLAN / ED COURSE  I reviewed the triage vital signs and the nursing notes.   Patient's presentation is most consistent with acute, uncomplicated illness.   Patient comes in with concerns for some pelvic pressure.  Differential includes UTI, pregnancy, STDs, PID.  No evidence of prolapse on examination.  We discussed seeing a pelvic floor PT if she continues to have the symptoms with negative workup.  No lower abdominal pain do not feel that this represents ovarian torsion.  We discussed return  precautions in regards to this.  Patient is urine was negative.  Price test was negative.  Wet prep was negative.  We discussed pros and cons of prophylactic treatment for PID given she reports the pelvic pressure and new sexual partner.  She would like to proceed with treatment.  Patient given ceftriaxone, doxycycline understands to avoid sex for 14 days follow-up in MyChart for results and have partner  treated if positive.    The patient is on the cardiac monitor to evaluate for evidence of arrhythmia and/or significant heart rate changes.      FINAL CLINICAL IMPRESSION(S) / ED DIAGNOSES   Final diagnoses:  PID (acute pelvic inflammatory disease)     Rx / DC Orders   ED Discharge Orders          Ordered    doxycycline (ADOXA) 100 MG tablet  2 times daily        02/17/23 1534             Note:  This document was prepared using Dragon voice recognition software and may include unintentional dictation errors.   Concha Se, MD 02/17/23  [phone]

## 2023-02-17 NOTE — ED Triage Notes (Signed)
Pt to ED for cramping felt to pelvic area/lower abdomen above SP since 1.5 weeks ago.  States has new sexual partner but uses condoms, denies new discharge or smell. Denies urinary symptoms.

## 2023-06-03 ENCOUNTER — Encounter (HOSPITAL_COMMUNITY): Payer: Self-pay | Admitting: *Deleted

## 2023-06-03 ENCOUNTER — Inpatient Hospital Stay (HOSPITAL_COMMUNITY)
Admission: AD | Admit: 2023-06-03 | Discharge: 2023-06-03 | Disposition: A | Discharge status: Home / Self Care | Payer: 59 | Attending: Obstetrics and Gynecology | Admitting: Obstetrics and Gynecology

## 2023-06-03 DIAGNOSIS — Z3202 Encounter for pregnancy test, result negative: Secondary | ICD-10-CM | POA: Diagnosis not present

## 2023-06-03 DIAGNOSIS — N939 Abnormal uterine and vaginal bleeding, unspecified: Secondary | ICD-10-CM | POA: Diagnosis not present

## 2023-06-03 DIAGNOSIS — Z3A00 Weeks of gestation of pregnancy not specified: Secondary | ICD-10-CM | POA: Diagnosis not present

## 2023-06-03 DIAGNOSIS — O209 Hemorrhage in early pregnancy, unspecified: Secondary | ICD-10-CM

## 2023-06-03 HISTORY — DX: Chlamydial infection, unspecified: A74.9

## 2023-06-03 HISTORY — DX: Urinary tract infection, site not specified: N39.0

## 2023-06-03 HISTORY — DX: Headache, unspecified: R51.9

## 2023-06-03 HISTORY — DX: Anemia, unspecified: D64.9

## 2023-06-03 LAB — CBC WITH DIFFERENTIAL/PLATELET
Abs Immature Granulocytes: 0.02 10*3/uL (ref 0.00–0.07)
Basophils Absolute: 0 10*3/uL (ref 0.0–0.1)
Basophils Relative: 0 %
Eosinophils Absolute: 0.1 10*3/uL (ref 0.0–0.5)
Eosinophils Relative: 2 %
HCT: 34.1 % — ABNORMAL LOW (ref 36.0–46.0)
Hemoglobin: 11.5 g/dL — ABNORMAL LOW (ref 12.0–15.0)
Immature Granulocytes: 0 %
Lymphocytes Relative: 26 %
Lymphs Abs: 1.6 10*3/uL (ref 0.7–4.0)
MCH: 30.8 pg (ref 26.0–34.0)
MCHC: 33.7 g/dL (ref 30.0–36.0)
MCV: 91.4 fL (ref 80.0–100.0)
Monocytes Absolute: 0.3 10*3/uL (ref 0.1–1.0)
Monocytes Relative: 5 %
Neutro Abs: 4.1 10*3/uL (ref 1.7–7.7)
Neutrophils Relative %: 67 %
Platelets: 182 10*3/uL (ref 150–400)
RBC: 3.73 MIL/uL — ABNORMAL LOW (ref 3.87–5.11)
RDW: 11.9 % (ref 11.5–15.5)
WBC: 6.1 10*3/uL (ref 4.0–10.5)
nRBC: 0 % (ref 0.0–0.2)

## 2023-06-03 LAB — URINALYSIS, ROUTINE W REFLEX MICROSCOPIC
Bilirubin Urine: NEGATIVE
Glucose, UA: NEGATIVE mg/dL
Ketones, ur: NEGATIVE mg/dL
Leukocytes,Ua: NEGATIVE
Nitrite: NEGATIVE
Protein, ur: NEGATIVE mg/dL
Specific Gravity, Urine: 1.027 (ref 1.005–1.030)
pH: 5 (ref 5.0–8.0)

## 2023-06-03 LAB — WET PREP, GENITAL
Clue Cells Wet Prep HPF POC: NONE SEEN
Sperm: NONE SEEN
Trich, Wet Prep: NONE SEEN
WBC, Wet Prep HPF POC: <10 (ref <10)
Yeast Wet Prep HPF POC: NONE SEEN

## 2023-06-03 LAB — HCG, QUANTITATIVE, PREGNANCY: hCG, Beta Chain, Quant, S: 6 m[IU]/mL — ABNORMAL HIGH (ref <5)

## 2023-06-03 LAB — POCT PREGNANCY, URINE: Preg Test, Ur: NEGATIVE

## 2023-06-03 LAB — ABO/RH: ABO/RH(D): B POS

## 2023-06-03 MED ORDER — ACETAMINOPHEN 500 MG PO TABS: 1000.0000 mg | ORAL_TABLET | Freq: Four times a day (QID) | ORAL | 0 refills | Status: DC | PRN

## 2023-06-03 NOTE — MAU Note (Signed)
Carol Schmitt is a 22 y.o. at Unknown here in MAU reporting: cramping woke up her this morning, when she went to the bathroom, she was bleeding. Has a pad on now, between light and moderate. No clots. Pregnancy has not been confirmed, has had 3+HPT - first was last Monday. LMP: 7/14 Onset of complaint: this morning Pain score: 5 Vitals:   06/03/23 1618  BP: 107/63  Pulse: 86  Resp: 17  Temp: 98.4 F (36.9 C)  SpO2: 100%      Lab orders placed from triage:  blood work drawn while in triage

## 2023-06-03 NOTE — MAU Provider Note (Signed)
Chief Complaint: Vaginal Bleeding, Abdominal Pain, and Possible Pregnancy   None    SUBJECTIVE HPI: Carol Schmitt is a 22 y.o. [id number] at Unknown who presents to Maternity Admissions reporting abd pain and light vaginal bleeding since this morning. + UPT x 3 first on 05/28/23. Neg UPT in MAU.   Pain score: 5  Associated signs and symptoms: Neg for fever, chills, vaginal discharge, urinary complaints, GI complaints.   Past Medical History:  Diagnosis Date   Anemia    Chlamydia    Headache    Preterm labor    UTI (urinary tract infection)    OB History  Gravida Para Term Preterm AB Living  3 2 1 1 1 2   SAB IAB Ectopic Multiple Live Births    1   0 2    # Outcome Date GA Lbr Len/2nd Weight Sex Type Anes PTL Lv  3 Preterm 09/19/21   1814 g M Toy [place] LIV  2 Term 05/02/20 [redacted]w[redacted]d / 00:17 2940 g M Vag-Spont EPI  LIV  1 IAB            Past Surgical History:  Procedure Laterality Date   THERAPEUTIC ABORTION     Social History   Socioeconomic History   Marital status: Single    Spouse name: Not on file   Number of children: Not on file   Years of education: Not on file   Highest education level: Not on file  Occupational History   Not on file  Tobacco Use   Smoking status: Never   Smokeless tobacco: Never  Vaping Use   Vaping status: Never Used  Substance and Sexual Activity   Alcohol use: Not Currently   Drug use: Not Currently    Types: Marijuana    Comment: Juy 2024   Sexual activity: Yes    Birth control/protection: I.U.D.    Comment: Undecided  Other Topics Concern   Not on file  Social History Narrative   Not on file   Social Determinants of Health   Financial Resource Strain: Not on file  Food Insecurity: Not on file  Transportation Needs: Not on file  Physical Activity: Not on file  Stress: Not on file  Social Connections: Not on file  Intimate Partner Violence: Not on file   Family History  Problem Relation Age of Onset   Diabetes Mother         gestational   Heart disease Mother        pt unsure   Other Father        unknown   No current facility-administered medications on file prior to encounter.   Current Outpatient Medications on File Prior to Encounter  Medication Sig Dispense Refill   acetaminophen (TYLENOL) 325 MG tablet Take 2 tablets (650 mg total) by mouth every 4 (four) hours as needed (for pain scale < 4  OR  temperature  >/=  100.5 F).     calcium carbonate (TUMS - DOSED IN MG ELEMENTAL CALCIUM) 500 MG chewable tablet Chew 2 tablets (400 mg of elemental calcium total) by mouth every 4 (four) hours as needed for indigestion. (Patient not taking: Reported on 09/19/2021)     docusate sodium (COLACE) 100 MG capsule Take 1 capsule (100 mg total) by mouth daily. (Patient not taking: Reported on 09/19/2021) 10 capsule 0   EPINEPHrine (EPIPEN 2-PAK) 0.3 mg/0.3 mL IJ SOAJ injection Inject 0.3 mg into the muscle as needed for anaphylaxis. 1 each 1  ferrous sulfate 325 (65 FE) MG tablet Take 1 tablet (325 mg total) by mouth daily with breakfast. Take with Vitamin C 30 tablet 1   Prenatal Vit-Fe Fumarate-FA (MULTIVITAMIN-PRENATAL) 27-0.8 MG TABS tablet Take 1 tablet by mouth daily at 12 noon.     Allergies  Allergen Reactions   Apple Juice Other (See Comments)    Mouth discomfort    I have reviewed patient's Past Medical Hx, Surgical Hx, Family Hx, Social Hx, medications and allergies.   Review of Systems  Constitutional:  Negative for chills and fever.  Gastrointestinal:  Positive for abdominal pain. Negative for constipation, diarrhea, nausea and vomiting.  Genitourinary:  Positive for vaginal bleeding. Negative for dysuria and vaginal discharge.  Neurological:  Negative for dizziness.    OBJECTIVE Patient Vitals for the past 24 hrs:  BP Temp Temp src Pulse Resp SpO2 Height Weight  06/03/23 1618 107/63 98.4 F (36.9 C) Oral 86 17 100 % 5' (1.524 m) 54.9 kg   Constitutional: Well-developed, well-nourished  female in no acute distress.  Cardiovascular: normal rate Respiratory: normal rate and effort.  GI: Abd soft, non-tender. MS: Extremities nontender, no edema, normal ROM Neurologic: Alert and oriented x 4.  GU: Blind swabs collected  LAB RESULTS hCG, quantitative, pregnancy   Collection Time: 06/03/23  4:21 PM  Result Value Ref Range   hCG, Beta Chain, Quant, S 6 (H) <5 mIU/mL  CBC with Differential/Platelet   Collection Time: 06/03/23  4:21 PM  Result Value Ref Range   WBC 6.1 4.0 - 10.5 K/uL   RBC 3.73 (L) 3.87 - 5.11 MIL/uL   Hemoglobin 11.5 (L) 12.0 - 15.0 g/dL   HCT 96.2 (L) 95.2 - 84.1 %   MCV 91.4 80.0 - 100.0 fL   MCH 30.8 26.0 - 34.0 pg   MCHC 33.7 30.0 - 36.0 g/dL   RDW 32.4 40.1 - 02.7 %   Platelets 182 150 - 400 K/uL   nRBC 0.0 0.0 - 0.2 %   Neutrophils Relative % 67 %   Neutro Abs 4.1 1.7 - 7.7 K/uL   Lymphocytes Relative 26 %   Lymphs Abs 1.6 0.7 - 4.0 K/uL   Monocytes Relative 5 %   Monocytes Absolute 0.3 0.1 - 1.0 K/uL   Eosinophils Relative 2 %   Eosinophils Absolute 0.1 0.0 - 0.5 K/uL   Basophils Relative 0 %   Basophils Absolute 0.0 0.0 - 0.1 K/uL   Immature Granulocytes 0 %   Abs Immature Granulocytes 0.02 0.00 - 0.07 K/uL  ABO/Rh   Collection Time: 06/03/23  4:21 PM  Result Value Ref Range   ABO/RH(D)      B POS Performed at [facility], [address] [place], [place] [address]   Culture, [place] Urine   Collection Time: 06/03/23  4:30 PM   Specimen: OB Clean Catch; Urine  Result Value Ref Range   Specimen Description OB CLEAN CATCH    Special Requests ADDED 2040    Culture      NO GROWTH Performed at [facility], [address], [place], [place] [address]    Report Status 06/04/2023 FINAL   Urinalysis, Routine w reflex microscopic -Urine, Clean Catch   Collection Time: 06/03/23  4:31 PM  Result Value Ref Range   Color, Urine YELLOW YELLOW   APPearance HAZY (A) CLEAR   Specific Gravity, Urine 1.027 1.005 - 1.030   pH 5.0  5.0 - 8.0   Glucose, UA NEGATIVE NEGATIVE mg/dL  Hgb urine dipstick LARGE (A) NEGATIVE   Bilirubin Urine NEGATIVE NEGATIVE   Ketones, ur NEGATIVE NEGATIVE mg/dL   Protein, ur NEGATIVE NEGATIVE mg/dL   Nitrite NEGATIVE NEGATIVE   Leukocytes,Ua NEGATIVE NEGATIVE   RBC / HPF 0-5 0 - 5 RBC/hpf   WBC, UA 0-5 0 - 5 WBC/hpf   Bacteria, UA RARE (A) NONE SEEN   Squamous Epithelial / HPF 0-5 0 - 5 /HPF   Mucus PRESENT   Wet prep, genital   Collection Time: 06/03/23  4:43 PM   Specimen: PATH Cytology Cervicovaginal Ancillary Only  Result Value Ref Range   Yeast Wet Prep HPF POC NONE SEEN NONE SEEN   Trich, Wet Prep NONE SEEN NONE SEEN   Clue Cells Wet Prep HPF POC NONE SEEN NONE SEEN   WBC, Wet Prep HPF POC <10 <10   Sperm NONE SEEN   Pregnancy, urine POC   Collection Time: 06/03/23  5:18 PM  Result Value Ref Range   Preg Test, Ur NEGATIVE NEGATIVE   IMAGING No results found.  MAU COURSE Orders Placed This Encounter  Procedures   Wet prep, genital   hCG, quantitative, pregnancy   CBC with Differential/Platelet   Urinalysis, Routine w reflex microscopic -Urine, Clean Catch   ABO/Rh   No orders of the defined types were placed in this encounter.   MDM - Mild bleeding and cramping w/ pos home UPT and hCG of 6 in MAU. Suspect early miscarriage. Imaging not indicated. Support given.   ASSESSMENT 1. Vaginal bleeding in pregnancy, first trimester    PLAN Discharge home in stable condition. SAB precautions Quant in 48 hours.    Follow-up Information     [facility] Follow up on 06/06/2023.   Specialty: Obstetrics and Gynecology Why: blood work Solicitor information: [address] [place] [address] [phone]        [facility] Follow up.   Specialty: Obstetrics and Gynecology Why: As needed in emergencies Contact information: [address] [place]  [address] [phone]                Allergies as of 06/03/2023       Reactions   Apple Juice Other (See Comments)   Mouth discomfort        Medication List     STOP taking these medications    calcium carbonate 500 MG chewable tablet Commonly known as: TUMS - dosed in mg elemental calcium   docusate sodium 100 MG capsule Commonly known as: COLACE   ferrous sulfate 325 (65 FE) MG tablet       TAKE these medications    acetaminophen 500 MG tablet Commonly known as: TYLENOL Take 2 tablets (1,000 mg total) by mouth every 6 (six) hours as needed for moderate pain, fever, headache or mild pain (for pain scale < 4  OR  temperature  >/=  100.5 F). What changed:  medication strength how much to take when to take this reasons to take this   EPINEPHrine 0.3 mg/0.3 mL Soaj injection Commonly known as: EpiPen 2-Pak Inject 0.3 mg into the muscle as needed for anaphylaxis.   multivitamin-prenatal 27-0.8 MG Tabs tablet Take 1 tablet by mouth daily at 12 noon.       Katrinka Blazing, [place], [place] 06/03/2023  5:12 PM

## 2023-06-04 LAB — CULTURE, OB URINE: Culture: NO GROWTH

## 2023-06-04 LAB — GC/CHLAMYDIA PROBE AMP (~~LOC~~) NOT AT ARMC
Chlamydia: NEGATIVE
Comment: NEGATIVE
Comment: NORMAL
Neisseria Gonorrhea: NEGATIVE

## 2023-06-06 ENCOUNTER — Other Ambulatory Visit: Payer: Self-pay

## 2023-06-06 ENCOUNTER — Other Ambulatory Visit (INDEPENDENT_AMBULATORY_CARE_PROVIDER_SITE_OTHER): Payer: 59

## 2023-06-06 VITALS — BP 109/74 | HR 88 | Wt 116.1 lb

## 2023-06-06 DIAGNOSIS — Z3A00 Weeks of gestation of pregnancy not specified: Secondary | ICD-10-CM

## 2023-06-06 DIAGNOSIS — O3680X0 Pregnancy with inconclusive fetal viability, not applicable or unspecified: Secondary | ICD-10-CM | POA: Diagnosis not present

## 2023-06-06 NOTE — Progress Notes (Signed)
Beta HCG Follow-up Visit  Lynnsey L Bencivengo presents to [facility] for follow-up beta HCG lab. She was seen in MAU for  vaginal bleeding and abdominal pain following positive UPT  on 06/03/23. Patient reports continued light vaginal bleeding. Denies pain today. Discussed with patient that we are following beta HCG levels today. Results will be available in MyChart likely tomorrow. Patient states midwife reviewed expected values for miscarriage vs normal pregnancy. Pt is still hopeful this is a normal pregnancy.   Marjo Bicker 06/06/2023 4:03 PM

## 2023-06-07 LAB — BETA HCG QUANT (REF LAB): hCG Quant: <1 m[IU]/mL

## 2023-07-06 ENCOUNTER — Inpatient Hospital Stay (HOSPITAL_COMMUNITY): Payer: 59

## 2023-07-06 ENCOUNTER — Inpatient Hospital Stay (HOSPITAL_COMMUNITY)
Admission: AD | Admit: 2023-07-06 | Discharge: 2023-07-06 | Disposition: A | Discharge status: Home / Self Care | Payer: 59 | Attending: Obstetrics & Gynecology | Admitting: Obstetrics & Gynecology

## 2023-07-06 ENCOUNTER — Encounter (HOSPITAL_COMMUNITY): Payer: Self-pay

## 2023-07-06 DIAGNOSIS — R102 Pelvic and perineal pain: Secondary | ICD-10-CM | POA: Diagnosis not present

## 2023-07-06 DIAGNOSIS — O26891 Other specified pregnancy related conditions, first trimester: Secondary | ICD-10-CM | POA: Diagnosis not present

## 2023-07-06 DIAGNOSIS — Z3A09 9 weeks gestation of pregnancy: Secondary | ICD-10-CM | POA: Diagnosis not present

## 2023-07-06 DIAGNOSIS — Z3A00 Weeks of gestation of pregnancy not specified: Secondary | ICD-10-CM | POA: Diagnosis not present

## 2023-07-06 DIAGNOSIS — N6342 Unspecified lump in left breast, subareolar: Secondary | ICD-10-CM

## 2023-07-06 DIAGNOSIS — O3680X0 Pregnancy with inconclusive fetal viability, not applicable or unspecified: Secondary | ICD-10-CM

## 2023-07-06 LAB — URINALYSIS, ROUTINE W REFLEX MICROSCOPIC
Bacteria, UA: NONE SEEN
Bilirubin Urine: NEGATIVE
Glucose, UA: NEGATIVE mg/dL
Hgb urine dipstick: NEGATIVE
Ketones, ur: NEGATIVE mg/dL
Leukocytes,Ua: NEGATIVE
Nitrite: NEGATIVE
Protein, ur: 30 mg/dL — AB
Specific Gravity, Urine: 1.029 (ref 1.005–1.030)
pH: 5 (ref 5.0–8.0)

## 2023-07-06 LAB — HCG, QUANTITATIVE, PREGNANCY: hCG, Beta Chain, Quant, S: 163 m[IU]/mL — ABNORMAL HIGH (ref <5)

## 2023-07-06 LAB — POCT PREGNANCY, URINE: Preg Test, Ur: POSITIVE — AB

## 2023-07-06 MED ORDER — AMOXICILLIN-POT CLAVULANATE 875-125 MG PO TABS: 1.0000 | ORAL_TABLET | Freq: Two times a day (BID) | ORAL | 0 refills | Status: DC

## 2023-07-06 MED ORDER — ACETAMINOPHEN 325 MG PO TABS
650.0000 mg | ORAL_TABLET | Freq: Once | ORAL | Status: AC
  Administered 2023-07-06: 650 mg via ORAL
  Filled 2023-07-06: qty 2

## 2023-07-06 NOTE — MAU Note (Signed)
..  Carol Schmitt is a 22 y.o. at Unknown here in MAU reporting: Left breast pain near her nipple, feels hard and constant. Has not taken anything for the pain but heat helps.  Had nipples pierced in March, took them out when she found out she was pregnant. Found out this week. Denies abdominal pain or vaginal bleeding.  Had a miscarriage last month, August 18th LMP: 04/29/2023  Pain score: 10/10 Vitals:   07/06/23 2007  BP: 123/76  Pulse: 84  Resp: 18  Temp: 98.1 F (36.7 C)  SpO2: 100%      Lab orders placed from triage:  POCT pregnancy test

## 2023-07-06 NOTE — MAU Provider Note (Incomplete)
History     CSN: [id number]  Arrival date and time: 07/06/23 1826   Event Date/Time   First Provider Initiated Contact with Patient 07/06/23 2254      Chief Complaint  Patient presents with  . Breast Pain    Carol Schmitt is a 22 y.o. [id number] at Unknown who receives care at 9.5 weeks by Definite LMP.  She presents today for breast and vaginal pain. She states the pain is located in left breast in nipple area.  She states she feels "something behind my nipple."  Patient states the pain has been present for about one week.  Patient states the pain is improved with heat and worsened with cold, touch, and nipple erection. She rates the pain a 10/10 and is constant.  She reports vaginal pain started 2 days ago and is "sharp pain."  She reports it is intermittent in nature and has no worsening or relieving factors. She states when it occurs it is a 7/10.  {GYN/OB [id number]  Past Medical History:  Diagnosis Date  . Anemia   . Chlamydia   . Headache   . Preterm labor   . UTI (urinary tract infection)     Past Surgical History:  Procedure Laterality Date  . THERAPEUTIC ABORTION      Family History  Problem Relation Age of Onset  . Diabetes Mother        gestational  . Heart disease Mother        pt unsure  . Other Father        unknown    Social History   Tobacco Use  . Smoking status: Never  . Smokeless tobacco: Never  Vaping Use  . Vaping status: Never Used  Substance Use Topics  . Alcohol use: Not Currently  . Drug use: Not Currently    Types: Marijuana    Comment: Juy 2024    Allergies:  Allergies  Allergen Reactions  . Apple Juice Other (See Comments)    Mouth discomfort    Medications Prior to Admission  Medication Sig Dispense Refill Last Dose  . acetaminophen (TYLENOL) 500 MG tablet Take 2 tablets (1,000 mg total) by mouth every 6 (six) hours as needed for moderate pain, fever, headache or mild pain (for pain scale < 4  OR  temperature  >/=   100.5 F). 60 tablet 0   . EPINEPHrine (EPIPEN 2-PAK) 0.3 mg/0.3 mL IJ SOAJ injection Inject 0.3 mg into the muscle as needed for anaphylaxis. 1 each 1   . Prenatal Vit-Fe Fumarate-FA (MULTIVITAMIN-PRENATAL) 27-0.8 MG TABS tablet Take 1 tablet by mouth daily at 12 noon.       Review of Systems  Gastrointestinal:  Positive for nausea and vomiting. Negative for abdominal pain (Cramping, intermittently), constipation and diarrhea.  Genitourinary:  Negative for difficulty urinating, dyspareunia, dysuria, vaginal bleeding and vaginal discharge.   Physical Exam   Blood pressure 123/76, pulse 84, temperature 98.1 F (36.7 C), temperature source Oral, resp. rate 18, height 5' (1.524 m), weight 54 kg, last menstrual period 04/29/2023, SpO2 100%, not currently breastfeeding.  Physical Exam  MAU Course  Procedures Results for orders placed or performed during the hospital encounter of 07/06/23 (from the past 24 hour(s))  Pregnancy, urine POC     Status: Abnormal   Collection Time: 07/06/23  7:21 PM  Result Value Ref Range   Preg Test, Ur POSITIVE (A) NEGATIVE  Urinalysis, Routine w reflex microscopic -Urine, Clean Catch     Status: Abnormal  Collection Time: 07/06/23  7:33 PM  Result Value Ref Range   Color, Urine YELLOW YELLOW   APPearance HAZY (A) CLEAR   Specific Gravity, Urine 1.029 1.005 - 1.030   pH 5.0 5.0 - 8.0   Glucose, UA NEGATIVE NEGATIVE mg/dL   Hgb urine dipstick NEGATIVE NEGATIVE   Bilirubin Urine NEGATIVE NEGATIVE   Ketones, ur NEGATIVE NEGATIVE mg/dL   Protein, ur 30 (A) NEGATIVE mg/dL   Nitrite NEGATIVE NEGATIVE   Leukocytes,Ua NEGATIVE NEGATIVE   RBC / HPF 0-5 0 - 5 RBC/hpf   WBC, UA 0-5 0 - 5 WBC/hpf   Bacteria, UA NONE SEEN NONE SEEN   Squamous Epithelial / HPF 0-5 0 - 5 /HPF   Mucus PRESENT   hCG, quantitative, pregnancy     Status: Abnormal   Collection Time: 07/06/23 10:33 PM  Result Value Ref Range   hCG, Beta Chain, Quant, S 163 (H) <5 mIU/mL   US OB LESS  THAN 14 WEEKS WITH OB TRANSVAGINAL  Result Date: 07/06/2023 CLINICAL DATA:  Viability EXAM: OBSTETRIC <14 WK [place] AND TRANSVAGINAL OB US TECHNIQUE: Both transabdominal and transvaginal ultrasound examinations were performed for complete evaluation of the gestation as well as the maternal uterus, adnexal regions, and pelvic cul-de-sac. Transvaginal technique was performed to assess early pregnancy. COMPARISON:  None Available. FINDINGS: Intrauterine gestational sac: None Yolk sac:  Not Visualized. Embryo:  Not Visualized. Cardiac Activity: Not Visualized. Maternal uterus/adnexae: Normal right and left ovaries. Small amount of free fluid in the pelvis. IMPRESSION: No intrauterine gestation. In the setting of positive pregnancy test and no definite intrauterine pregnancy, this reflects a pregnancy of unknown location. Differential considerations include early normal IUP, abnormal IUP, or nonvisualized ectopic pregnancy. Differentiation is achieved with serial beta HCG supplemented by repeat sonography as clinically warranted. Electronically Signed   By: Annia Belt M.D.   On: 07/06/2023 22:15    MDM ***  Assessment and Plan  ***  Carol Schmitt 07/06/2023, 10:54 PM   Reassessment (11:44 PM)

## 2023-07-06 NOTE — MAU Provider Note (Signed)
History     CSN: [id number]  Arrival date and time: 07/06/23 1826   Event Date/Time   First Provider Initiated Contact with Patient 07/06/23 2254      Chief Complaint  Patient presents with   Breast Pain   HPI Carol Schmitt is a 22 y.o. [id number] at Unknown who receives care at 9.5 weeks by Definite LMP.  She presents today for breast and vaginal pain. She states the pain is located in left breast in nipple area.  She states she feels "something behind my nipple."  Patient states the pain has been present for about one week.  Patient states the pain is improved with heat and worsened with cold, touch, and nipple erection. She rates the pain a 10/10 and is constant.  She reports vaginal pain started 2 days ago and is "sharp pain."  She reports it is intermittent in nature and has no worsening or relieving factors. She states when it occurs it is a 7/10.  {GYN/OB [id number]  Past Medical History:  Diagnosis Date   Anemia    Chlamydia    Headache    Preterm labor    UTI (urinary tract infection)     Past Surgical History:  Procedure Laterality Date   THERAPEUTIC ABORTION      Family History  Problem Relation Age of Onset   Diabetes Mother        gestational   Heart disease Mother        pt unsure   Other Father        unknown    Social History   Tobacco Use   Smoking status: Never   Smokeless tobacco: Never  Vaping Use   Vaping status: Never Used  Substance Use Topics   Alcohol use: Not Currently   Drug use: Not Currently    Types: Marijuana    Comment: Juy 2024    Allergies:  Allergies  Allergen Reactions   Apple Juice Other (See Comments)    Mouth discomfort    Medications Prior to Admission  Medication Sig Dispense Refill Last Dose   acetaminophen (TYLENOL) 500 MG tablet Take 2 tablets (1,000 mg total) by mouth every 6 (six) hours as needed for moderate pain, fever, headache or mild pain (for pain scale < 4  OR  temperature  >/=  100.5 F). 60  tablet 0    EPINEPHrine (EPIPEN 2-PAK) 0.3 mg/0.3 mL IJ SOAJ injection Inject 0.3 mg into the muscle as needed for anaphylaxis. 1 each 1    Prenatal Vit-Fe Fumarate-FA (MULTIVITAMIN-PRENATAL) 27-0.8 MG TABS tablet Take 1 tablet by mouth daily at 12 noon.       Review of Systems  Gastrointestinal:  Positive for nausea and vomiting. Negative for abdominal pain (Cramping, intermittently), constipation and diarrhea.  Genitourinary:  Negative for difficulty urinating, dyspareunia, dysuria, vaginal bleeding and vaginal discharge.   Physical Exam   Blood pressure 123/76, pulse 84, temperature 98.1 F (36.7 C), temperature source Oral, resp. rate 18, height 5' (1.524 m), weight 54 kg, last menstrual period 04/29/2023, SpO2 100%, not currently breastfeeding.  Physical Exam  MAU Course  Procedures Results for orders placed or performed during the hospital encounter of 07/06/23 (from the past 24 hour(s))  Pregnancy, urine POC     Status: Abnormal   Collection Time: 07/06/23  7:21 PM  Result Value Ref Range   Preg Test, Ur POSITIVE (A) NEGATIVE  Urinalysis, Routine w reflex microscopic -Urine, Clean Catch     Status: Abnormal  Collection Time: 07/06/23  7:33 PM  Result Value Ref Range   Color, Urine YELLOW YELLOW   APPearance HAZY (A) CLEAR   Specific Gravity, Urine 1.029 1.005 - 1.030   pH 5.0 5.0 - 8.0   Glucose, UA NEGATIVE NEGATIVE mg/dL   Hgb urine dipstick NEGATIVE NEGATIVE   Bilirubin Urine NEGATIVE NEGATIVE   Ketones, ur NEGATIVE NEGATIVE mg/dL   Protein, ur 30 (A) NEGATIVE mg/dL   Nitrite NEGATIVE NEGATIVE   Leukocytes,Ua NEGATIVE NEGATIVE   RBC / HPF 0-5 0 - 5 RBC/hpf   WBC, UA 0-5 0 - 5 WBC/hpf   Bacteria, UA NONE SEEN NONE SEEN   Squamous Epithelial / HPF 0-5 0 - 5 /HPF   Mucus PRESENT   hCG, quantitative, pregnancy     Status: Abnormal   Collection Time: 07/06/23 10:33 PM  Result Value Ref Range   hCG, Beta Chain, Quant, S 163 (H) <5 mIU/mL   US OB LESS THAN 14 WEEKS  WITH OB TRANSVAGINAL  Result Date: 07/06/2023 CLINICAL DATA:  Viability EXAM: OBSTETRIC <14 WK [place] AND TRANSVAGINAL OB US TECHNIQUE: Both transabdominal and transvaginal ultrasound examinations were performed for complete evaluation of the gestation as well as the maternal uterus, adnexal regions, and pelvic cul-de-sac. Transvaginal technique was performed to assess early pregnancy. COMPARISON:  None Available. FINDINGS: Intrauterine gestational sac: None Yolk sac:  Not Visualized. Embryo:  Not Visualized. Cardiac Activity: Not Visualized. Maternal uterus/adnexae: Normal right and left ovaries. Small amount of free fluid in the pelvis. IMPRESSION: No intrauterine gestation. In the setting of positive pregnancy test and no definite intrauterine pregnancy, this reflects a pregnancy of unknown location. Differential considerations include early normal IUP, abnormal IUP, or nonvisualized ectopic pregnancy. Differentiation is achieved with serial beta HCG supplemented by repeat sonography as clinically warranted. Electronically Signed   By: Annia Belt M.D.   On: 07/06/2023 22:15    MDM ***  Assessment and Plan  ***  Cherre Robins 07/06/2023, 10:54 PM   Reassessment (11:44 PM)

## 2023-08-01 ENCOUNTER — Inpatient Hospital Stay: Admission: RE | Admit: 2023-08-01 | Payer: 59 | Source: Ambulatory Visit

## 2023-09-01 ENCOUNTER — Inpatient Hospital Stay (HOSPITAL_COMMUNITY)
Admission: AD | Admit: 2023-09-01 | Discharge: 2023-09-01 | Disposition: A | Discharge status: Home / Self Care | Payer: 59 | Attending: Family Medicine | Admitting: Family Medicine

## 2023-09-01 DIAGNOSIS — M549 Dorsalgia, unspecified: Secondary | ICD-10-CM | POA: Insufficient documentation

## 2023-09-01 DIAGNOSIS — R1084 Generalized abdominal pain: Secondary | ICD-10-CM

## 2023-09-01 DIAGNOSIS — O26891 Other specified pregnancy related conditions, first trimester: Secondary | ICD-10-CM | POA: Insufficient documentation

## 2023-09-01 DIAGNOSIS — R102 Pelvic and perineal pain: Secondary | ICD-10-CM | POA: Diagnosis not present

## 2023-09-01 DIAGNOSIS — Z3A12 12 weeks gestation of pregnancy: Secondary | ICD-10-CM | POA: Diagnosis not present

## 2023-09-01 DIAGNOSIS — O26892 Other specified pregnancy related conditions, second trimester: Secondary | ICD-10-CM

## 2023-09-01 LAB — URINALYSIS, ROUTINE W REFLEX MICROSCOPIC
Bilirubin Urine: NEGATIVE
Glucose, UA: NEGATIVE mg/dL
Hgb urine dipstick: NEGATIVE
Ketones, ur: NEGATIVE mg/dL
Leukocytes,Ua: NEGATIVE
Nitrite: NEGATIVE
Protein, ur: NEGATIVE mg/dL
Specific Gravity, Urine: 1.026 (ref 1.005–1.030)
pH: 6 (ref 5.0–8.0)

## 2023-09-01 LAB — WET PREP, GENITAL
Clue Cells Wet Prep HPF POC: NONE SEEN
Sperm: NONE SEEN
Trich, Wet Prep: NONE SEEN
WBC, Wet Prep HPF POC: <10 (ref <10)
Yeast Wet Prep HPF POC: NONE SEEN

## 2023-09-01 NOTE — MAU Provider Note (Signed)
History     CSN: [id number]  Arrival date and time: 09/01/23 0104   Event Date/Time   First Provider Initiated Contact with Patient 09/01/23 0158      Chief Complaint  Patient presents with   Abdominal Pain   Vaginal Pain   Carol Schmitt is a 22 y.o. [id number] at [redacted]w[redacted]d who receives care at [facility].  She presents today for abdominal and back pain.  She states she has been having intermittent pain that lasts 5-10 minutes in her back and abdomen.  She also reports some sharp shooting pain and pressure in the vaginal area.  She states her symptoms are worse with walking and sometimes causes cramping.  She states she has not taken anything for the pain and it started about 0030 tonight.  She denies relieving or worsening factors for the pain. She rates the pain a 8/10 and states it is not like RLP, which she has experienced in previous pregnancies. Patient endorses sexual activity yesterday, but no pain or discomfort. No issues with urination and some mild constipation. No vaginal bleeding or discharge or concern.   OB History     Gravida  4   Para  2   Term  1   Preterm  1   AB  1   Living  2      SAB      IAB  1   Ectopic      Multiple  0   Live Births  2           Past Medical History:  Diagnosis Date   Anemia    Chlamydia    Headache    Preterm labor    UTI (urinary tract infection)     Past Surgical History:  Procedure Laterality Date   THERAPEUTIC ABORTION      Family History  Problem Relation Age of Onset   Diabetes Mother        gestational   Heart disease Mother        pt unsure   Other Father        unknown    Social History   Tobacco Use   Smoking status: Never   Smokeless tobacco: Never  Vaping Use   Vaping status: Never Used  Substance Use Topics   Alcohol use: Not Currently   Drug use: Not Currently    Types: Marijuana    Comment: Juy 2024    Allergies:  Allergies  Allergen Reactions   Apple Juice Other (See  Comments)    Mouth discomfort    Medications Prior to Admission  Medication Sig Dispense Refill Last Dose   acetaminophen (TYLENOL) 500 MG tablet Take 2 tablets (1,000 mg total) by mouth every 6 (six) hours as needed for moderate pain, fever, headache or mild pain (for pain scale < 4  OR  temperature  >/=  100.5 F). 60 tablet 0    amoxicillin-clavulanate (AUGMENTIN) 875-125 MG tablet Take 1 tablet by mouth every 12 (twelve) hours. 14 tablet 0    EPINEPHrine (EPIPEN 2-PAK) 0.3 mg/0.3 mL IJ SOAJ injection Inject 0.3 mg into the muscle as needed for anaphylaxis. 1 each 1    Prenatal Vit-Fe Fumarate-FA (MULTIVITAMIN-PRENATAL) 27-0.8 MG TABS tablet Take 1 tablet by mouth daily at 12 noon.       Review of Systems  Eyes:  Negative for visual disturbance.  Gastrointestinal:  Positive for abdominal pain. Negative for constipation, diarrhea, nausea and vomiting.  Genitourinary:  Negative for  difficulty urinating, dysuria, vaginal bleeding and vaginal discharge.  Musculoskeletal:  Positive for back pain.  Neurological:  Positive for dizziness and light-headedness. Negative for headaches.   Physical Exam   Blood pressure 107/71, pulse 87, temperature 98.3 F (36.8 C), temperature source Oral, resp. rate 16, height 5\' 1"  (1.549 m), weight 58.1 kg, last menstrual period 04/29/2023, not currently breastfeeding.  Physical Exam Vitals and nursing note reviewed. Exam conducted with a chaperone present Stanton Kidney, RN).  Constitutional:      General: She is not in acute distress.    Appearance: She is well-developed. She is not toxic-appearing.  HENT:     Head: Normocephalic and atraumatic.  Eyes:     Conjunctiva/sclera: Conjunctivae normal.  Cardiovascular:     Rate and Rhythm: Normal rate.  Pulmonary:     Effort: Pulmonary effort is normal. No respiratory distress.  Abdominal:     Palpations: Abdomen is soft.     Tenderness: There is no abdominal tenderness.     Comments: Gravid, Appears LGA   Musculoskeletal:        General: Normal range of motion.     Cervical back: Normal range of motion.  Skin:    General: Skin is warm and dry.  Neurological:     Mental Status: She is alert and oriented to person, place, and time.  Psychiatric:        Mood and Affect: Mood normal.        Behavior: Behavior normal.    MAU Course  Procedures Results for orders placed or performed during the hospital encounter of 09/01/23 (from the past 24 hour(s))  Urinalysis, Routine w reflex microscopic -Urine, Clean Catch     Status: Abnormal   Collection Time: 09/01/23  1:32 AM  Result Value Ref Range   Color, Urine YELLOW YELLOW   APPearance HAZY (A) CLEAR   Specific Gravity, Urine 1.026 1.005 - 1.030   pH 6.0 5.0 - 8.0   Glucose, UA NEGATIVE NEGATIVE mg/dL   Hgb urine dipstick NEGATIVE NEGATIVE   Bilirubin Urine NEGATIVE NEGATIVE   Ketones, ur NEGATIVE NEGATIVE mg/dL   Protein, ur NEGATIVE NEGATIVE mg/dL   Nitrite NEGATIVE NEGATIVE   Leukocytes,Ua NEGATIVE NEGATIVE  Wet prep, genital     Status: None   Collection Time: 09/01/23  1:38 AM  Result Value Ref Range   Yeast Wet Prep HPF POC NONE SEEN NONE SEEN   Trich, Wet Prep NONE SEEN NONE SEEN   Clue Cells Wet Prep HPF POC NONE SEEN NONE SEEN   WBC, Wet Prep HPF POC <10 <10   Sperm NONE SEEN     MDM Labs: UA Physical Exam Culture: Wet Prep, GC/CT Assessment and Plan  22 year old, [id number]  SIUP at 12.6 weeks Abdominal Pain Back Pain  -Reviewed POC with patient. -Exam performed and findings discussed.  -Patient offered and declines pain medication.  -Reviewed usage of maternity belt despite early pregnancy. Discussed how short interval pregnancies can cause more discomforts earlier. -Discussed obtaining cultures to r/o infectious process.  Patient agreeable.  -Wet prep, GC/CT obtained. -Informed that if results negative will discharge to home and patient to monitor. -Patient agreeable and without questions.    Cherre Robins 09/01/2023, 1:58 AM   Reassessment (2:36 AM) -Results return as above. -Nurse to give discharge instructions. -Encouraged to call primary office or return to MAU if symptoms worsen or with the onset of new symptoms. -Discharged to home in stable condition.  Cherre Robins MSN,  CNM Systems developer, [facility] [employer]

## 2023-09-01 NOTE — Discharge Instructions (Signed)
   PREGNANCY SUPPORT BELT: You are not alone, Seventy-five percent of women have some sort of abdominal or back pain at some point in their pregnancy. Your baby is growing at a fast pace, which means that your whole body is rapidly trying to adjust to the changes. As your uterus grows, your back may start feeling a bit under stress and this can result in back or abdominal pain that can go from mild, and therefore bearable, to severe pains that will not allow you to sit or lay down comfortably, When it comes to dealing with pregnancy-related pains and cramps, some pregnant women usually prefer natural remedies, which the market is filled with nowadays. For example, wearing a pregnancy support belt can help ease and lessen your discomfort and pain. WHAT ARE THE BENEFITS OF WEARING A PREGNANCY SUPPORT BELT? A pregnancy support belt provides support to the lower portion of the belly taking some of the weight of the growing uterus and distributing to the other parts of your body. It is designed make you comfortable and gives you extra support. Over the years, the pregnancy apparel market has been studying the needs and wants of pregnant women and they have come up with the most comfortable pregnancy support belts that woman could ever ask for. In fact, you will no longer have to wear a stretched-out or bulky pregnancy belt that is visible underneath your clothes and makes you feel even more uncomfortable. Nowadays, a pregnancy support belt is made of comfortable and stretchy materials that will not irritate your skin but will actually make you feel at ease and you will not even notice you are wearing it. They are easy to put on and adjust during the day and can be worn at night for additional support.  BENEFITS: Relives Back pain Relieves Abdominal Muscle and Leg Pain Stabilizes the Pelvic Ring Offers a Cushioned Abdominal Lift Pad Relieves pressure on the Sciatic Nerve Within Minutes    Locations that Carry  Maternity/Pregnancy Support Belts  You can find belts on [employer].com starting at $10-$15.  2.  The [facility] carries belts for $10-$15.        Address/Phone: [address], [address], [place], [place] [address], [phone]  3.  You may be able to file your insurance with [email or web address] and have a belt mailed to you.  If you have any problems getting the belt, let your Healthcare office know.

## 2023-09-01 NOTE — MAU Note (Signed)
Pt says EDC is 03-09-2024 - changed by OB  Feels cramping- sore ness in vag- on / off  Wednesday- started painful to walk. At 0100- felt shooting pains in lower abd  and her back 8/10- no meds PNC- [facility]

## 2023-09-03 DIAGNOSIS — O09212 Supervision of pregnancy with history of pre-term labor, second trimester: Secondary | ICD-10-CM | POA: Insufficient documentation

## 2023-09-03 DIAGNOSIS — O09211 Supervision of pregnancy with history of pre-term labor, first trimester: Secondary | ICD-10-CM | POA: Insufficient documentation

## 2023-09-03 DIAGNOSIS — O09213 Supervision of pregnancy with history of pre-term labor, third trimester: Secondary | ICD-10-CM

## 2023-09-03 LAB — GC/CHLAMYDIA PROBE AMP (~~LOC~~) NOT AT ARMC
Chlamydia: NEGATIVE
Comment: NEGATIVE
Comment: NORMAL
Neisseria Gonorrhea: NEGATIVE

## 2023-09-07 DIAGNOSIS — O09299 Supervision of pregnancy with other poor reproductive or obstetric history, unspecified trimester: Secondary | ICD-10-CM | POA: Diagnosis not present

## 2023-09-07 DIAGNOSIS — O09211 Supervision of pregnancy with history of pre-term labor, first trimester: Secondary | ICD-10-CM | POA: Diagnosis not present

## 2023-09-07 LAB — OB RESULTS CONSOLE RUBELLA ANTIBODY, IGM: Rubella: IMMUNE

## 2023-09-07 LAB — OB RESULTS CONSOLE HEPATITIS B SURFACE ANTIGEN: Hepatitis B Surface Ag: NEGATIVE

## 2023-09-07 LAB — OB RESULTS CONSOLE VARICELLA ZOSTER ANTIBODY, IGG: Varicella: IMMUNE

## 2023-10-17 NOTE — L&D Delivery Note (Signed)
 Delivery Note  First Stage: Labor onset: 0620 Augmentation : cytotec , pitocin , AROM Analgesia /Anesthesia intrapartum: epidural AROM at 0841  Second Stage: Complete dilation at 1218 Onset of pushing at 1218 FHR second stage Category II, recurrent variable decelerations with moderate variability  Delivery of a viable female infant 03/05/2024 at 1233 by Lavanda Porter, CNM. delivery of fetal head in OA position with restitution to ROA. Loose nuchal cord reduced at delivery; compound presentation of right hand present.  Anterior then posterior shoulders delivered easily with gentle downward traction. Baby placed on mom's chest, and attended to by peds.  Cord double clamped after cessation of pulsation, cut by FOB  Third Stage: TXA given with delivery of the infant for postpartum hemorrhage prophylaxis.  Placenta delivered Ileana Mallard intact with 3 VC @ 1243 Placenta disposition: discarded Cytotec  placed rectally after delivery of the placenta for postpartum hemorrhage prophylaxis d/t hx of postpartum hemorrhage. Uterine tone firm with massage and sweep / bleeding initially light, then began trickling. Fundal massage gave large gushes of blood, up to . Pitocin  bolus started upon completion of TXA administration. Patient given 100mcg IV fentanyl  and manual sweep performed with handfuls of clots removed. Uterus then firm and midline and bleeding slowed. I&O cath drained about urine. Blood loss in the bag noted to be at . Bleeding became heavy again and patient consented for [facility] [address] placement. Jada placed with ease and the uterus became firm and bleeding slowed and stopped, the blood did not reach the canister.  Total blood loss noted to be .   1g Ancef IV given for endometritis prevention.  Right periurethral abrasion identified  Anesthesia for repair: none Repair none needed Est. Blood Loss (mL):  Complications: none  Mom to postpartum.  Baby to Couplet care /  Skin to Skin.  Newborn: Birth Weight: TBD, infant skin-to-skin  Apgar Scores: 7, 9 Feeding planned: breastfeeding and formula

## 2023-11-07 ENCOUNTER — Observation Stay: Payer: Medicaid Other

## 2023-11-07 ENCOUNTER — Observation Stay
Admission: EM | Admit: 2023-11-07 | Discharge: 2023-11-08 | Disposition: A | Discharge status: Home / Self Care | Payer: 59 | Attending: Obstetrics and Gynecology | Admitting: Obstetrics and Gynecology

## 2023-11-07 ENCOUNTER — Encounter: Payer: Self-pay | Admitting: Obstetrics and Gynecology

## 2023-11-07 ENCOUNTER — Other Ambulatory Visit: Payer: Self-pay

## 2023-11-07 DIAGNOSIS — Z3A22 22 weeks gestation of pregnancy: Secondary | ICD-10-CM | POA: Diagnosis not present

## 2023-11-07 DIAGNOSIS — Z3686 Encounter for antenatal screening for cervical length: Secondary | ICD-10-CM | POA: Diagnosis not present

## 2023-11-07 DIAGNOSIS — R9389 Abnormal findings on diagnostic imaging of other specified body structures: Secondary | ICD-10-CM | POA: Diagnosis not present

## 2023-11-07 DIAGNOSIS — Z79899 Other long term (current) drug therapy: Secondary | ICD-10-CM | POA: Diagnosis not present

## 2023-11-07 DIAGNOSIS — O26899 Other specified pregnancy related conditions, unspecified trimester: Principal | ICD-10-CM | POA: Diagnosis present

## 2023-11-07 DIAGNOSIS — O26892 Other specified pregnancy related conditions, second trimester: Principal | ICD-10-CM | POA: Insufficient documentation

## 2023-11-07 DIAGNOSIS — R102 Pelvic and perineal pain: Secondary | ICD-10-CM | POA: Diagnosis not present

## 2023-11-07 LAB — WET PREP, GENITAL
Clue Cells Wet Prep HPF POC: NONE SEEN
Sperm: NONE SEEN
Trich, Wet Prep: NONE SEEN
WBC, Wet Prep HPF POC: >=10 — AB (ref <10)
Yeast Wet Prep HPF POC: NONE SEEN

## 2023-11-07 LAB — FETAL FIBRONECTIN: Fetal Fibronectin: POSITIVE — AB

## 2023-11-07 MED ORDER — LACTATED RINGERS IV SOLN: Freq: Once | INTRAVENOUS | Status: AC

## 2023-11-07 MED ORDER — LACTATED RINGERS IV BOLUS
1000.0000 mL | Freq: Once | INTRAVENOUS | Status: AC
  Administered 2023-11-07: 1000 mL via INTRAVENOUS

## 2023-11-07 MED ORDER — LACTATED RINGERS IV SOLN: INTRAVENOUS | Status: DC

## 2023-11-07 MED ORDER — TERBUTALINE SULFATE 1 MG/ML IJ SOLN
0.2500 mg | Freq: Once | INTRAMUSCULAR | Status: DC
  Filled 2023-11-07: qty 1

## 2023-11-07 NOTE — OB Triage Note (Signed)
Patient is a [redacted]w[redacted]d who presents to unit c/o pelvic tightness and pressure since this morning. Reports sexual intercourse in the last 24 hours and watery discharge yesterday. Reports +FM, denies vaginal bleeding. Doppler FHT 160. Toco applied and assessing. Vital signs WDL.

## 2023-11-07 NOTE — Progress Notes (Signed)
Patient wheeled down to ultrasound.

## 2023-11-08 ENCOUNTER — Observation Stay: Payer: 59

## 2023-11-08 ENCOUNTER — Encounter: Payer: Self-pay | Admitting: Obstetrics and Gynecology

## 2023-11-08 DIAGNOSIS — O99891 Other specified diseases and conditions complicating pregnancy: Secondary | ICD-10-CM | POA: Diagnosis not present

## 2023-11-08 DIAGNOSIS — O26892 Other specified pregnancy related conditions, second trimester: Secondary | ICD-10-CM | POA: Diagnosis not present

## 2023-11-08 DIAGNOSIS — R102 Pelvic and perineal pain: Secondary | ICD-10-CM | POA: Diagnosis not present

## 2023-11-08 DIAGNOSIS — O09212 Supervision of pregnancy with history of pre-term labor, second trimester: Secondary | ICD-10-CM | POA: Diagnosis not present

## 2023-11-08 DIAGNOSIS — Z3A22 22 weeks gestation of pregnancy: Secondary | ICD-10-CM | POA: Diagnosis not present

## 2023-11-08 LAB — CHLAMYDIA/NGC RT PCR (ARMC ONLY)
Chlamydia Tr: NOT DETECTED
N gonorrhoeae: NOT DETECTED

## 2023-11-08 NOTE — Discharge Summary (Signed)
Carol Schmitt is a 23 y.o. female. She is at [redacted]w[redacted]d gestation. Patient's last menstrual period was 04/29/2023. Estimated Date of Delivery: 03/09/24  Prenatal care site: [facility]  Chief complaint: c/o pelvic tightness and pressure since yesterday morning  HPI: Carol Schmitt presents to L&D with complaints of  c/o pelvic tightness and pressure since yesterday morning. She endorsed having recent sexual intercourse  Factors complicating pregnancy: Hx of preterm labor starting at 29 weeks with G2 and delivery at 32 weeks Hx of preeclampsia Hx of PP hemorrhage with G1 Hx of blood transfusion  S: Resting comfortably. no CTX, no VB.no LOF,  Active fetal movement.   Maternal Medical History:  Past Medical Hx:  has a past medical history of Anemia, Chlamydia, Headache, Preterm labor, and UTI (urinary tract infection).    Past Surgical Hx:  has a past surgical history that includes Therapeutic abortion.   Allergies  Allergen Reactions   Apple Juice Other (See Comments)    Mouth discomfort     Prior to Admission medications   Medication Sig Start Date End Date Taking? Authorizing Provider  acetaminophen (TYLENOL) 500 MG tablet Take 2 tablets (1,000 mg total) by mouth every 6 (six) hours as needed for moderate pain, fever, headache or mild pain (for pain scale < 4  OR  temperature  >/=  100.5 F). 06/03/23   Katrinka Blazing, [place], CNM  EPINEPHrine (EPIPEN 2-PAK) 0.3 mg/0.3 mL IJ SOAJ injection Inject 0.3 mg into the muscle as needed for anaphylaxis. 05/20/21   Merwyn Katos, MD  Prenatal Vit-Fe Fumarate-FA (MULTIVITAMIN-PRENATAL) 27-0.8 MG TABS tablet Take 1 tablet by mouth daily at 12 noon.    [provider]    Social History: She  reports that she has never smoked. She has never used smokeless tobacco. She reports that she does not currently use alcohol. She reports that she does not currently use drugs after having used the following drugs: Marijuana.  Family History: family  history includes Diabetes in her mother; Heart disease in her mother; Other in her father. ,no history of gyn cancers  Review of Systems: A full review of systems was performed and negative except as noted in the HPI.    O:  BP 101/63 (BP Location: Left Arm)   Pulse 73   Temp 98.2 F (36.8 C) (Oral)   Resp 14   Ht 5' (1.524 m)   Wt 61.7 kg   LMP 04/29/2023   BMI 26.56 kg/m  Results for orders placed or performed during the hospital encounter of 11/07/23 (from the past 48 hours)  Wet prep, genital   Collection Time: 11/07/23  6:00 PM  Result Value Ref Range   Yeast Wet Prep HPF POC NONE SEEN NONE SEEN   Trich, Wet Prep NONE SEEN NONE SEEN   Clue Cells Wet Prep HPF POC NONE SEEN NONE SEEN   WBC, Wet Prep HPF POC >=10 (A) <10   Sperm NONE SEEN   Fetal fibronectin   Collection Time: 11/07/23  6:46 PM  Result Value Ref Range   Fetal Fibronectin POSITIVE (A) NEGATIVE  Chlamydia/NGC rt PCR ([facility] only)   Collection Time: 11/08/23 11:02 AM   Specimen: Cervical/Vaginal swab  Result Value Ref Range   Specimen source GC/Chlam ENDOCERVICAL    Chlamydia Tr NOT DETECTED NOT DETECTED   N gonorrhoeae NOT DETECTED NOT DETECTED     Constitutional: NAD, AAOx3  HE/ENT: extraocular movements grossly intact, moist mucous membranes CV: RRR PULM: nl respiratory effort, CTABL Abd:  gravid, non-tender, non-distended, soft  Ext: Non-tender, Nonedmeatous Psych: mood appropriate, speech normal Pelvic : deferred SVE: Dilation: Closed Effacement (%): Thick Exam by:: Oxley CNM   FHT: via doppler 148  Assessment: 23 y.o. [redacted]w[redacted]d here for antenatal surveillance during pregnancy.  Principle diagnosis: pelvic pressure in pregnancy Narrative & Impression  CLINICAL DATA:  [id number] History of preterm delivery, currently pregnant [id number] Pelvic pressure in pregnancy [id number]. Assigned gestational age [redacted] weeks and 4 days.   EXAM: LIMITED OBSTETRIC ULTRASOUND   COMPARISON:  None Available.    FINDINGS: Number of Fetuses: 1   Heart Rate:  150 bpm   Movement: Yes   Presentation: Cephalic   Placental Location: Posterior   Previa: No   Amniotic Fluid (Subjective):  Within normal limits.   FL: 3.73 cm 21 w  6 d   MATERNAL FINDINGS:   Cervix:  Appears closed.  3.8 cm   Uterus/Adnexae: No abnormality visualized.   IMPRESSION: Single live intrauterine pregnancy with gestational age by ultrasound of 21 weeks and 6 days which is concordant with assigned gestational age of [redacted] weeks and 4 days.   This exam is performed on an emergent basis and does not comprehensively evaluate fetal size, dating, or anatomy; follow-up complete OB US should be considered if further fetal assessment is warranted.     Electronically Signed   By: Tish Frederickson M.D.   On: 11/08/2023 09:01      Plan: Labor: not present.  Fetal Wellbeing: Reassuring per ob limited ultrasound x 2 GC/Chlamydia - neg Wet Prep- Neg FFN - Positive F/U appt at [facility] on 11/09/23 for anatomy scan per patient D/c home stable, precautions reviewed, follow-up as scheduled.   ----- Chari Manning, CNM Certified Nurse Midwife [facility]

## 2023-11-08 NOTE — OB Triage Note (Signed)
Discharge instructions, labor precautions, and follow-up care reviewed with patient and significant other. All questions answered. Patient verbalized understanding. Discharged ambulatory off unit.  

## 2023-11-08 NOTE — Progress Notes (Signed)
Pt reports that she no longer feels contractions, only cramping. Pt rested well through the night. Daksha Koone B. Ryatt Corsino, RN BSN 11/08/2023 5:54 AM

## 2023-11-09 DIAGNOSIS — M545 Low back pain, unspecified: Secondary | ICD-10-CM | POA: Diagnosis not present

## 2023-11-09 DIAGNOSIS — O09292 Supervision of pregnancy with other poor reproductive or obstetric history, second trimester: Secondary | ICD-10-CM | POA: Diagnosis not present

## 2023-11-09 DIAGNOSIS — O0992 Supervision of high risk pregnancy, unspecified, second trimester: Secondary | ICD-10-CM | POA: Diagnosis not present

## 2023-11-09 DIAGNOSIS — O09212 Supervision of pregnancy with history of pre-term labor, second trimester: Secondary | ICD-10-CM | POA: Diagnosis not present

## 2023-11-09 DIAGNOSIS — R109 Unspecified abdominal pain: Secondary | ICD-10-CM | POA: Diagnosis not present

## 2023-12-08 ENCOUNTER — Observation Stay
Admission: EM | Admit: 2023-12-08 | Discharge: 2023-12-08 | Disposition: A | Discharge status: Home / Self Care | Payer: 59 | Attending: Obstetrics and Gynecology | Admitting: Obstetrics and Gynecology

## 2023-12-08 ENCOUNTER — Observation Stay: Payer: 59

## 2023-12-08 ENCOUNTER — Encounter: Payer: Self-pay | Admitting: Obstetrics and Gynecology

## 2023-12-08 ENCOUNTER — Other Ambulatory Visit: Payer: Self-pay

## 2023-12-08 DIAGNOSIS — O4702 False labor before 37 completed weeks of gestation, second trimester: Principal | ICD-10-CM | POA: Insufficient documentation

## 2023-12-08 DIAGNOSIS — Z79899 Other long term (current) drug therapy: Secondary | ICD-10-CM | POA: Diagnosis not present

## 2023-12-08 DIAGNOSIS — O23592 Infection of other part of genital tract in pregnancy, second trimester: Secondary | ICD-10-CM | POA: Diagnosis not present

## 2023-12-08 DIAGNOSIS — Z3A26 26 weeks gestation of pregnancy: Secondary | ICD-10-CM | POA: Diagnosis not present

## 2023-12-08 DIAGNOSIS — O4700 False labor before 37 completed weeks of gestation, unspecified trimester: Principal | ICD-10-CM | POA: Diagnosis present

## 2023-12-08 LAB — URINALYSIS, ROUTINE W REFLEX MICROSCOPIC
Bilirubin Urine: NEGATIVE
Glucose, UA: NEGATIVE mg/dL
Hgb urine dipstick: NEGATIVE
Ketones, ur: NEGATIVE mg/dL
Nitrite: NEGATIVE
Protein, ur: NEGATIVE mg/dL
Specific Gravity, Urine: 1.02 (ref 1.005–1.030)
pH: 6 (ref 5.0–8.0)

## 2023-12-08 LAB — WET PREP, GENITAL
Clue Cells Wet Prep HPF POC: NONE SEEN
Sperm: NONE SEEN
Trich, Wet Prep: NONE SEEN
WBC, Wet Prep HPF POC: >=10 — AB (ref <10)

## 2023-12-08 LAB — RUPTURE OF MEMBRANE (ROM)PLUS: Rom Plus: NEGATIVE

## 2023-12-08 MED ORDER — ACETAMINOPHEN 325 MG PO TABS: 650.0000 mg | ORAL_TABLET | ORAL | Status: DC | PRN

## 2023-12-08 MED ORDER — CALCIUM CARBONATE ANTACID 500 MG PO CHEW: 2.0000 | CHEWABLE_TABLET | ORAL | Status: DC | PRN

## 2023-12-08 MED ORDER — FLUCONAZOLE 50 MG PO TABS
150.0000 mg | ORAL_TABLET | Freq: Once | ORAL | Status: AC
  Administered 2023-12-08: 150 mg via ORAL
  Filled 2023-12-08: qty 1

## 2023-12-08 NOTE — OB Triage Note (Signed)
 23 y.o G4P2 presents to L&D triage at [redacted]w[redacted]d w a c/o leaking of fluid, losing her mucous plug, and ctx's. She reports she lost her mucous plug while working this morning and states that she's been feeling leaking of clear, odorless fluid for the past week. Pt reports feeling ctx's every ~10 minutes and states that some of them are painful. She endorses fetal movement, denies vaginal bleeding, and denies Pre-E symptoms. Initial fht's in the 150's-160's w fetal movement palpated and heard. Wilson CNM aware of pt arrival, POC includes [place] and vaginal swabs. Vitals wnl.

## 2023-12-08 NOTE — OB Triage Note (Signed)
 Pt discharged home by [facility]. Cervix is unchanged, discharge instructions reviewed and pt verbalized understanding. Going home ambulatory and stable with significant other.

## 2023-12-08 NOTE — Discharge Summary (Signed)
 Carol Schmitt is a 23 y.o. female. She is at [redacted]w[redacted]d gestation. Patient's last menstrual period was 04/29/2023. Estimated Date of Delivery: 03/09/24  Prenatal care site: [facility]   Current pregnancy complicated by:  Hx of preterm labor starting at 29 weeks with G2 and delivery at 32 weeks Hx of preeclampsia Hx of shoulder dystocia with G1 Hx of PP hemorrhage with G1 Hx of blood transfusion  Chief complaint: preterm uterine contractions  She reports uterine contractions that started around 11am when she was standing at work. The contractions are coming regularly and they are painful and she feels them in the front. She denies any bleeding. She reports increased vaginal discharge and is concerned her water may have broken. She has a hx of PTL with her last pregnancy where she began contracting around 29 weeks and delivered at 32wks.  S: Resting comfortably. Occasional CTX, no VB.no LOF,  Active fetal movement.  Denies: HA, visual changes, SOB, or RUQ/epigastric pain  Maternal Medical History:   Past Medical History:  Diagnosis Date   Anemia    Chlamydia    Headache    Preterm labor    UTI (urinary tract infection)     Past Surgical History:  Procedure Laterality Date   THERAPEUTIC ABORTION      Allergies  Allergen Reactions   Apple Juice Other (See Comments)    Mouth discomfort    Prior to Admission medications   Medication Sig Start Date End Date Taking? Authorizing Provider  Prenatal Vit-Fe Fumarate-FA (MULTIVITAMIN-PRENATAL) 27-0.8 MG TABS tablet Take 1 tablet by mouth daily at 12 noon.   Yes [provider]  acetaminophen (TYLENOL) 500 MG tablet Take 2 tablets (1,000 mg total) by mouth every 6 (six) hours as needed for moderate pain, fever, headache or mild pain (for pain scale < 4  OR  temperature  >/=  100.5 F). 06/03/23   Katrinka Blazing, [place], CNM  EPINEPHrine (EPIPEN 2-PAK) 0.3 mg/0.3 mL IJ SOAJ injection Inject 0.3 mg into the muscle as needed for  anaphylaxis. 05/20/21   Merwyn Katos, MD    Social History: She  reports that she has never smoked. She has never used smokeless tobacco. She reports that she does not currently use alcohol. She reports that she does not currently use drugs after having used the following drugs: Marijuana.  Family History: family history includes Diabetes in her mother; Heart disease in her mother; Other in her father.  no history of gyn cancers  Review of Systems: A full review of systems was performed and negative except as noted in the HPI.    O:  BP (!) 104/56 (BP Location: Left Arm)   Pulse 88   Temp 97.9 F (36.6 C) (Oral)   Resp 16   Ht 5' (1.524 m)   LMP 04/29/2023   BMI 26.56 kg/m  Results for orders placed or performed during the hospital encounter of 12/08/23 (from the past 48 hours)  Wet prep, genital   Collection Time: 12/08/23  2:53 PM  Result Value Ref Range   Yeast Wet Prep HPF POC PRESENT (A) NONE SEEN   Trich, Wet Prep NONE SEEN NONE SEEN   Clue Cells Wet Prep HPF POC NONE SEEN NONE SEEN   WBC, Wet Prep HPF POC >=10 (A) <10   Sperm NONE SEEN   Urinalysis, Routine w reflex microscopic -Urine, Clean Catch   Collection Time: 12/08/23  2:53 PM  Result Value Ref Range   Color, Urine YELLOW (A)  YELLOW   APPearance HAZY (A) CLEAR   Specific Gravity, Urine 1.020 1.005 - 1.030   pH 6.0 5.0 - 8.0   Glucose, UA NEGATIVE NEGATIVE mg/dL   Hgb urine dipstick NEGATIVE NEGATIVE   Bilirubin Urine NEGATIVE NEGATIVE   Ketones, ur NEGATIVE NEGATIVE mg/dL   Protein, ur NEGATIVE NEGATIVE mg/dL   Nitrite NEGATIVE NEGATIVE   Leukocytes,Ua TRACE (A) NEGATIVE   RBC / HPF 0-5 0 - 5 RBC/hpf   WBC, UA 0-5 0 - 5 WBC/hpf   Bacteria, UA RARE (A) NONE SEEN   Squamous Epithelial / HPF 11-20 0 - 5 /HPF   Mucus PRESENT   Rupture of Membrane (ROM) Plus   Collection Time: 12/08/23  2:55 PM  Result Value Ref Range   Rom Plus NEGATIVE      OB US:  CLINICAL DATA:  Pre term contractions, assess  cervical length   EXAM: LIMITED TRANSVAGINAL OBSTETRIC ULTRASOUND   FINDINGS: Endovaginal pelvic ultrasound was performed to assess cervical length. The cervix is closed, measuring 3.9 cm in length. Fetus is identified in cephalic presentation.   IMPRESSION: 1. Limited endovaginal obstetric ultrasound performed to assess cervical length only. 2. The cervix measures 3.9 cm and is closed.     Electronically Signed   By: Sharlet Salina M.D.   On: 12/08/2023 16:09  Constitutional: NAD, AAOx3  HE/ENT: extraocular movements grossly intact, moist mucous membranes CV: RRR PULM: nl respiratory effort, CTABL     Abd: gravid, non-tender, non-distended, soft      Ext: Non-tender, Nonedematous   Psych: mood appropriate, speech normal Pelvic: SSE done, cervix closed and thick, no fluid seen coming from cervical os  Pelvic exam: normal external genitalia, vulva, vagina, cervix, uterus and adnexa.  Fetal  monitoring: reassuring, Appropriate for GA Unable to get full NST d/t early gestation and fetal movement. Able to monitor FHT for twice, at the beginning and end of hte visit. FHR 145bpm with moderate variability. No decelerations audible or noted. Overall reassuring fetal status.  Uterine contractions: q6-39min, palpate mild, unchanged over 3hrs of monitoring  A/P: 23 y.o. [redacted]w[redacted]d here for antenatal surveillance for preterm uterine contractions  Principle Diagnosis:  Preterm Uterine Contractions, Vaginal yeast infection  Preterm labor: not present. Contractions unchanged after 3 hours of monitoring and she reports they are less painful, cervix closed, thick, and unchanged on two SSE after 3 hours. Cervical length 3.9cm. Her contractions have not intensified. Reviewed [facility] Preterm labor algorithm and discussed with Dr. Algis Downs. [place] and since cervix is >3cm, cervix is unchanged, and contractions have not intensified, patient is stable for discharge with strict PTL precautions given.   Vaginal yeast infection: treat with Diflucan 150mg  PO x 1, discussed with patient and her partner that vaginal infections are common causes of preterm uterine contractions.  UA WNL ROM plus negative Fetal Wellbeing: Reassuring  D/c home stable, precautions reviewed, follow-up as scheduled.    Janyce Llanos, CNM 12/08/2023 6:16 PM

## 2023-12-22 ENCOUNTER — Other Ambulatory Visit: Payer: Self-pay

## 2023-12-22 ENCOUNTER — Encounter: Payer: Self-pay | Admitting: Obstetrics and Gynecology

## 2023-12-22 ENCOUNTER — Observation Stay

## 2023-12-22 ENCOUNTER — Observation Stay: Admission: EM | Admit: 2023-12-22 | Discharge: 2023-12-22 | Disposition: A | Discharge status: Home / Self Care

## 2023-12-22 DIAGNOSIS — O09219 Supervision of pregnancy with history of pre-term labor, unspecified trimester: Secondary | ICD-10-CM

## 2023-12-22 DIAGNOSIS — O42912 Preterm premature rupture of membranes, unspecified as to length of time between rupture and onset of labor, second trimester: Principal | ICD-10-CM | POA: Insufficient documentation

## 2023-12-22 DIAGNOSIS — Z3A28 28 weeks gestation of pregnancy: Secondary | ICD-10-CM | POA: Insufficient documentation

## 2023-12-22 DIAGNOSIS — Z0371 Encounter for suspected problem with amniotic cavity and membrane ruled out: Principal | ICD-10-CM | POA: Diagnosis present

## 2023-12-22 LAB — WET PREP, GENITAL
Clue Cells Wet Prep HPF POC: NONE SEEN
Sperm: NONE SEEN
Trich, Wet Prep: NONE SEEN
WBC, Wet Prep HPF POC: <10 (ref <10)
Yeast Wet Prep HPF POC: NONE SEEN

## 2023-12-22 LAB — URINALYSIS, COMPLETE (UACMP) WITH MICROSCOPIC
Bilirubin Urine: NEGATIVE
Glucose, UA: NEGATIVE mg/dL
Hgb urine dipstick: NEGATIVE
Ketones, ur: NEGATIVE mg/dL
Leukocytes,Ua: NEGATIVE
Nitrite: NEGATIVE
Protein, ur: NEGATIVE mg/dL
RBC / HPF: 0 RBC/hpf (ref 0–5)
Specific Gravity, Urine: 1.021 (ref 1.005–1.030)
pH: 6 (ref 5.0–8.0)

## 2023-12-22 LAB — FETAL FIBRONECTIN: Fetal Fibronectin: NEGATIVE

## 2023-12-22 LAB — RUPTURE OF MEMBRANE (ROM)PLUS: Rom Plus: NEGATIVE

## 2023-12-22 LAB — CHLAMYDIA/NGC RT PCR (ARMC ONLY)
Chlamydia Tr: NOT DETECTED
N gonorrhoeae: NOT DETECTED

## 2023-12-22 MED ORDER — CALCIUM CARBONATE ANTACID 500 MG PO CHEW: 2.0000 | CHEWABLE_TABLET | ORAL | Status: DC | PRN

## 2023-12-22 MED ORDER — ACETAMINOPHEN 500 MG PO TABS: 1000.0000 mg | ORAL_TABLET | Freq: Four times a day (QID) | ORAL | Status: DC | PRN

## 2023-12-22 NOTE — Discharge Instructions (Signed)

## 2023-12-22 NOTE — OB Triage Note (Signed)
 Discharge home. Labor precautions reviewed. Left floor ambulatory with significant other. Elaina Hoops

## 2023-12-22 NOTE — OB Triage Note (Signed)
 Pt here today c/o LOF. Reports that she continues leaking small amounts of clear fluid as she has for weeks. C/o irregular ctx with back pain. Denies vaginal bleeding. Reports intercourse 2-3 night ago. Fetal movement audible and palpable. C/o discomfort with urination. Also c/o pelvic pressure. Carol Schmitt

## 2023-12-22 NOTE — Discharge Summary (Signed)
 Carol Schmitt is a 23 y.o. female. She is at [redacted]w[redacted]d gestation. Patient's last menstrual period was 04/29/2023. 03/09/2024, by Other Basis   Prenatal care site: [facility]  Chief complaint: vaginal leakage of fluid   Admission Diagnoses:  1) intrauterine pregnancy at [redacted]w[redacted]d  2) Leakage of amniotic fluid [O42.90]  Discharge Diagnoses:  Principal Problem:   Encounter for suspected premature rupture of membranes, with rupture of membranes not found Active Problems:   Current pregnancy with history of preterm labor   HPI: Carol Schmitt presents to L&D with complaints of leakage of fluid. She reports leaking intermittently for past 3 weeks or more. Denies feeling a gush of fluid. Reports vaginal leaking has not changed and has been ongoing. Denies vaginal odor, pain, or itching. Reports infrequent contractions that are worse at night and improve during the day. Denies cramping or vaginal bleeding. She has previously been evaluated for vaginal leaking and was negative for ROM.  Carol Schmitt reports intercourse 2-3 days ago. Her pregnancy is complicated by history of preterm delivery at 32 weeks, history of preeclampsia, history of PPH, and history of shoulder dystocia .  She denies Contractions or Vaginal bleeding. Endorses fetal movement as active.   S: Resting comfortably. no CTX, no VB.no current LOF,  Active fetal movement.   Maternal Medical History:  Past Medical Hx:  has a past medical history of Anemia, Chlamydia, Headache, Preterm labor, and UTI (urinary tract infection).    Past Surgical Hx:  has a past surgical history that includes Therapeutic abortion.   Allergies  Allergen Reactions   Apple Juice Other (See Comments)    Mouth discomfort     Prior to Admission medications   Medication Sig Start Date End Date Taking? Authorizing Provider  Prenatal Vit-Fe Fumarate-FA (MULTIVITAMIN-PRENATAL) 27-0.8 MG TABS tablet Take 1 tablet by mouth daily at 12 noon.   Yes [provider]  acetaminophen (TYLENOL) 500 MG tablet Take 2 tablets (1,000 mg total) by mouth every 6 (six) hours as needed for moderate pain, fever, headache or mild pain (for pain scale < 4  OR  temperature  >/=  100.5 F). Patient not taking: Reported on 12/22/2023 06/03/23   Katrinka Blazing, [place], CNM  EPINEPHrine (EPIPEN 2-PAK) 0.3 mg/0.3 mL IJ SOAJ injection Inject 0.3 mg into the muscle as needed for anaphylaxis. 05/20/21   Merwyn Katos, MD    Social History: She  reports that she has never smoked. She has never used smokeless tobacco. She reports that she does not currently use alcohol. She reports that she does not currently use drugs after having used the following drugs: Marijuana.  Family History: family history includes Diabetes in her mother; Heart disease in her mother; Other in her father.   Review of Systems: A full review of systems was performed and negative except as noted in the HPI.     Pertinent Results:  O:  BP (!) 111/59 (BP Location: Right Arm)   Pulse 84   Temp 98.5 F (36.9 C) (Oral)   Resp 16   Ht 5' (1.524 m)   Wt 68.9 kg   LMP 04/29/2023   BMI 29.69 kg/m  Results for orders placed or performed during the hospital encounter of 12/22/23 (from the past 48 hours)  Wet prep, genital   Collection Time: 12/22/23  4:18 PM   Specimen: Vaginal  Result Value Ref Range   Yeast Wet Prep HPF POC NONE SEEN NONE SEEN   Trich, Wet Prep NONE SEEN NONE SEEN  Clue Cells Wet Prep HPF POC NONE SEEN NONE SEEN   WBC, Wet Prep HPF POC <10 <10   Sperm NONE SEEN   Chlamydia/NGC rt PCR ([facility] only)   Collection Time: 12/22/23  4:18 PM   Specimen: Vaginal; Genital  Result Value Ref Range   Specimen source GC/Chlam ENDOCERVICAL    Chlamydia Tr NOT DETECTED NOT DETECTED   N gonorrhoeae NOT DETECTED NOT DETECTED  Fetal fibronectin   Collection Time: 12/22/23  4:18 PM  Result Value Ref Range   Fetal Fibronectin NEGATIVE NEGATIVE  Urinalysis, Complete w Microscopic -Urine, Clean  Catch   Collection Time: 12/22/23  4:18 PM  Result Value Ref Range   Color, Urine YELLOW (A) YELLOW   APPearance HAZY (A) CLEAR   Specific Gravity, Urine 1.021 1.005 - 1.030   pH 6.0 5.0 - 8.0   Glucose, UA NEGATIVE NEGATIVE mg/dL   Hgb urine dipstick NEGATIVE NEGATIVE   Bilirubin Urine NEGATIVE NEGATIVE   Ketones, ur NEGATIVE NEGATIVE mg/dL   Protein, ur NEGATIVE NEGATIVE mg/dL   Nitrite NEGATIVE NEGATIVE   Leukocytes,Ua NEGATIVE NEGATIVE   RBC / HPF 0 0 - 5 RBC/hpf   WBC, UA 0-5 0 - 5 WBC/hpf   Bacteria, UA MANY (A) NONE SEEN   Squamous Epithelial / HPF 0-5 0 - 5 /HPF   Mucus PRESENT   Rupture of Membrane (ROM) Plus   Collection Time: 12/22/23  4:18 PM  Result Value Ref Range   Rom Plus NEGATIVE      US OB Limited Result Date: 12/22/2023 CLINICAL DATA:  History of preterm delivery EXAM: LIMITED OBSTETRIC ULTRASOUND AND TRANSVAGINAL OBSTETRIC ULTRASOUND FINDINGS: Number of Fetuses: 1 Heart Rate:  158 bpm Movement: Yes Presentation: Cephalic Placental Location: Posterior Previa: No Amniotic Fluid (Subjective): Within normal limits. AFI: 10.4 cm BPD:  6.96cm 28w 0d MATERNAL FINDINGS: Cervix:  Appears closed.  Cervical length 4.0 cm Uterus/Adnexae:  No abnormality visualized. IMPRESSION: Single intrauterine gestation at sonographic gestational age [redacted] weeks, 0 days. Fetal heart rate 158 beats per minute. This exam is performed on an emergent basis and does not comprehensively evaluate fetal size, dating, or anatomy; follow-up complete OB US should be considered if further fetal assessment is warranted. Electronically Signed   By: Jearld Lesch M.D.   On: 12/22/2023 17:28   US OB Transvaginal Result Date: 12/22/2023 CLINICAL DATA:  History of preterm delivery EXAM: LIMITED OBSTETRIC ULTRASOUND AND TRANSVAGINAL OBSTETRIC ULTRASOUND FINDINGS: Number of Fetuses: 1 Heart Rate:  158 bpm Movement: Yes Presentation: Cephalic Placental Location: Posterior Previa: No Amniotic Fluid (Subjective): Within  normal limits. AFI: 10.4 cm BPD:  6.96cm 28w 0d MATERNAL FINDINGS: Cervix:  Appears closed.  Cervical length 4.0 cm Uterus/Adnexae:  No abnormality visualized. IMPRESSION: Single intrauterine gestation at sonographic gestational age [redacted] weeks, 0 days. Fetal heart rate 158 beats per minute. This exam is performed on an emergent basis and does not comprehensively evaluate fetal size, dating, or anatomy; follow-up complete OB US should be considered if further fetal assessment is warranted. Electronically Signed   By: Jearld Lesch M.D.   On: 12/22/2023 17:28   US OB Transvaginal Result Date: 12/08/2023 CLINICAL DATA:  Pre term contractions, assess cervical length EXAM: LIMITED TRANSVAGINAL OBSTETRIC ULTRASOUND FINDINGS: Endovaginal pelvic ultrasound was performed to assess cervical length. The cervix is closed, measuring 3.9 cm in length. Fetus is identified in cephalic presentation. IMPRESSION: 1. Limited endovaginal obstetric ultrasound performed to assess cervical length only. 2. The cervix measures 3.9 cm and is closed.  Electronically Signed   By: Sharlet Salina M.D.   On: 12/08/2023 16:09    Constitutional: NAD, AAOx3  PULM: nl respiratory effort Abd: gravid Psych: mood appropriate, speech normal Pelvic : no significant discharge or fluid, no vaginal bleeding  Cervix: closed, cervical length 3.9 cm   NST: Baseline FHR: 145 beats/min Variability: moderate Accelerations: present Decelerations: absent Tocometry: Infrequent  Time: at least 20 minutes   Interpretation: Category I INDICATIONS: rule out uterine contractions RESULTS:  A NST procedure was performed with FHR monitoring and a normal baseline established, appropriate time of 20-40 minutes of evaluation, and accels >2 seen w 15x15 characteristics.  Results show a REACTIVE NST.   Consults: None  Procedures: NST, Ultrasound   Hospital Course: The patient was admitted to Labor and Delivery Triage for observation. ROM + was negative  and she had no continued leaking of fluid. Screens for vaginal infections were negative. UA was reassuring, urine culture is pending. [place] was reassuring with AFI of 10.4 cm, cephalic presentation, and cervical length of 3.9 cm. Strict preterm labor precautions were reviewed and when to return to L&D for evaluation.   She was deemed stable for discharge and further outpatient management.   Discharge Condition: stable  Disposition: Discharge disposition: 01-Home or Self Care        Allergies as of 12/22/2023       Reactions   Apple Juice Other (See Comments)   Mouth discomfort        Medication List     TAKE these medications    acetaminophen 500 MG tablet Commonly known as: TYLENOL Take 2 tablets (1,000 mg total) by mouth every 6 (six) hours as needed for moderate pain, fever, headache or mild pain (for pain scale < 4  OR  temperature  >/=  100.5 F).   EPINEPHrine 0.3 mg/0.3 mL Soaj injection Commonly known as: EpiPen 2-Pak Inject 0.3 mg into the muscle as needed for anaphylaxis.   multivitamin-prenatal 27-0.8 MG Tabs tablet Take 1 tablet by mouth daily at 12 noon.        Follow-up Information     [facility]. Go on 12/24/2023.   Why: routine prenatal appointment Contact information: [address] [place] [address] [phone]               ----- Gustavo Lah, CNM  Certified Nurse Midwife [facility]

## 2023-12-23 LAB — URINE CULTURE: Culture: NO GROWTH

## 2023-12-24 LAB — OB RESULTS CONSOLE RPR: RPR: NONREACTIVE

## 2023-12-24 LAB — OB RESULTS CONSOLE HIV ANTIBODY (ROUTINE TESTING): HIV: NONREACTIVE

## 2024-01-15 ENCOUNTER — Observation Stay
Admission: EM | Admit: 2024-01-15 | Discharge: 2024-01-15 | Disposition: A | Discharge status: Home / Self Care | Attending: Obstetrics and Gynecology | Admitting: Obstetrics and Gynecology

## 2024-01-15 ENCOUNTER — Inpatient Hospital Stay (HOSPITAL_COMMUNITY)
Admission: RE | Admit: 2024-01-15 | Payer: Medicaid Other | Source: Home / Self Care | Admitting: Obstetrics & Gynecology

## 2024-01-15 ENCOUNTER — Other Ambulatory Visit: Payer: Self-pay

## 2024-01-15 ENCOUNTER — Encounter: Payer: Self-pay | Admitting: Obstetrics and Gynecology

## 2024-01-15 ENCOUNTER — Observation Stay

## 2024-01-15 DIAGNOSIS — O4700 False labor before 37 completed weeks of gestation, unspecified trimester: Secondary | ICD-10-CM | POA: Diagnosis present

## 2024-01-15 DIAGNOSIS — O4703 False labor before 37 completed weeks of gestation, third trimester: Principal | ICD-10-CM | POA: Insufficient documentation

## 2024-01-15 DIAGNOSIS — Z3A32 32 weeks gestation of pregnancy: Secondary | ICD-10-CM | POA: Diagnosis not present

## 2024-01-15 DIAGNOSIS — O368130 Decreased fetal movements, third trimester, not applicable or unspecified: Secondary | ICD-10-CM | POA: Insufficient documentation

## 2024-01-15 DIAGNOSIS — O99013 Anemia complicating pregnancy, third trimester: Secondary | ICD-10-CM | POA: Diagnosis not present

## 2024-01-15 DIAGNOSIS — O09299 Supervision of pregnancy with other poor reproductive or obstetric history, unspecified trimester: Principal | ICD-10-CM

## 2024-01-15 DIAGNOSIS — Z0371 Encounter for suspected problem with amniotic cavity and membrane ruled out: Secondary | ICD-10-CM

## 2024-01-15 LAB — WET PREP, GENITAL
Clue Cells Wet Prep HPF POC: NONE SEEN
Sperm: NONE SEEN
Trich, Wet Prep: NONE SEEN
WBC, Wet Prep HPF POC: <10 (ref <10)
Yeast Wet Prep HPF POC: NONE SEEN

## 2024-01-15 LAB — GROUP B STREP BY PCR: Group B strep by PCR: NEGATIVE

## 2024-01-15 LAB — FETAL FIBRONECTIN: Fetal Fibronectin: NEGATIVE

## 2024-01-15 NOTE — H&P (Signed)
 HISTORY AND PHYSICAL NOTE  History of Present Illness: Carol Schmitt is a 23 y.o. [id number] at [redacted]w[redacted]d admitted for Preterm labor.  She presented to L&D with complaints of uterine contractions.    Reports active fetal movement  Contractions: every 10 minutes, started last night, is able to sleep through them LOF/SROM: denies Vaginal bleeding: denies Fetal presentation is cephalic on u/s on 12/22/23  Factors complicating pregnancy:  Anemia  Hx of preterm delivery with G2 at 32wks with PTL the same pregnancy at 29wks Hx of shoulder dystocia with G1, weighed 2.94g Hx of pre-eclampsia with G1 at 39wks  Hx of postpartum hemorrhage with G1   Prenatal care site:  [facility]  Patient Active Problem List   Diagnosis Date Noted   Preterm uterine contractions 01/15/2024   Encounter for suspected premature rupture of membranes, with rupture of membranes not found 12/22/2023   High risk pregnancy due to history of preterm labor in second trimester 09/03/2023   Preterm labor third tri w preterm del third tri, fetus 1 09/21/2021   Preterm contractions 09/14/2021   Current pregnancy with history of preterm labor 08/31/2021   History of postpartum hemorrhage, currently pregnant 05/02/2020   History of shoulder dystocia in prior pregnancy, currently pregnant 05/02/2020   Hx of preeclampsia, prior pregnancy, currently pregnant 05/01/2020    Past Medical History:  Diagnosis Date   Anemia    Chlamydia    Headache    Preterm labor    UTI (urinary tract infection)     Past Surgical History:  Procedure Laterality Date   THERAPEUTIC ABORTION      OB History  Gravida Para Term Preterm AB Living  4 2 1 1 1 2   SAB IAB Ectopic Multiple Live Births   1  0 2    # Outcome Date GA Lbr Len/2nd Weight Sex Type Anes PTL Lv  4 Current           3 Preterm 09/19/21 [redacted]w[redacted]d  1814 g M [place] LIV  2 Term 05/02/20 [redacted]w[redacted]d / 00:17 2940 g M Vag-Spont EPI  LIV  1 IAB             Social  History:  reports that she has never smoked. She has never used smokeless tobacco. She reports that she does not currently use alcohol. She reports that she does not currently use drugs after having used the following drugs: Marijuana.  Family History: family history includes Diabetes in her mother; Heart disease in her mother; Other in her father.  Allergies  Allergen Reactions   Apple Juice Other (See Comments)    Mouth discomfort    Medications Prior to Admission  Medication Sig Dispense Refill Last Dose/Taking   Prenatal Vit-Fe Fumarate-FA (MULTIVITAMIN-PRENATAL) 27-0.8 MG TABS tablet Take 1 tablet by mouth daily at 12 noon.   Past Week   acetaminophen (TYLENOL) 500 MG tablet Take 2 tablets (1,000 mg total) by mouth every 6 (six) hours as needed for moderate pain, fever, headache or mild pain (for pain scale < 4  OR  temperature  >/=  100.5 F). (Patient not taking: Reported on 12/22/2023) 60 tablet 0    EPINEPHrine (EPIPEN 2-PAK) 0.3 mg/0.3 mL IJ SOAJ injection Inject 0.3 mg into the muscle as needed for anaphylaxis. 1 each 1     Review of Systems  Constitutional: Negative.   HENT: Negative.    Eyes: Negative.   Respiratory: Negative.    Cardiovascular: Negative.   Gastrointestinal: Negative.  Skin: Negative.   Neurological: Negative.   Psychiatric/Behavioral: Negative.      Physical Examination: Vitals:  BP 106/65   Temp 98.1 F (36.7 C) (Oral)   LMP 04/29/2023  General: no acute distress.  HEENT: normocephalic Heart: regular rate & rhythm Lungs: normal respiratory effort Abdomen: soft, gravid, non-tender Pelvic:   External: Normal external female genitalia  Cervix: Dilation: Closed / Effacement (%): Thick / Station: Ballotable  Extremities: non-tender, symmetric Neurologic: Alert & oriented x 3.    Membranes: intact FHT:  FHR: 145 bpm, variability: moderate,  accelerations:  Present,  decelerations:  Absent Category/reactivity:  Category I UC:   regular, every 8  minutes  Labs:  Results for orders placed or performed during the hospital encounter of 01/15/24 (from the past 24 hours)  Wet prep, genital   Collection Time: 01/15/24  8:13 PM  Result Value Ref Range   Yeast Wet Prep HPF POC NONE SEEN NONE SEEN   Trich, Wet Prep NONE SEEN NONE SEEN   Clue Cells Wet Prep HPF POC NONE SEEN NONE SEEN   WBC, Wet Prep HPF POC <10 <10   Sperm NONE SEEN   Group B strep by PCR   Collection Time: 01/15/24  8:13 PM   Specimen: Vaginal/Rectal; Genital  Result Value Ref Range   Group B strep by PCR PRESUMPTIVE NEGATIVE PRESUMPTIVE NEGATIVE  Fetal fibronectin   Collection Time: 01/15/24  8:13 PM  Result Value Ref Range   Fetal Fibronectin NEGATIVE NEGATIVE    Prenatal Labs: Blood type/Rh B pos  Antibody screen neg  Rubella Immune  Varicella Immune  RPR NR  HBsAg Neg  Hep C NR  HIV NR  GC neg  Chlamydia neg  Genetic screening cfDNA negative   1 hour GTT 117  3 hour GTT N/a  GBS pending    Imaging Studies: US OB Limited Result Date: 01/15/2024 CLINICAL DATA:  [id number] Preterm uterine contractions [id number] EXAM: LIMITED OBSTETRIC ULTRASOUND AND TRANSVAGINAL OBSTETRIC ULTRASOUND FINDINGS: Number of Fetuses: 1 Heart Rate:  160 bpm Movement: Yes Presentation: Cephalic Placental Location: Posterior Previa: No Amniotic Fluid (Subjective): Within normal limits. Largest vertical pocket 4.7 cm AFI: 16.7 cm BPD:  7.6cm 30w 2d MATERNAL FINDINGS: Cervix:  Closed with a cervical length of 4.0 cm.  No funneling. Uterus/Adnexae:  No abnormality visualized. IMPRESSION: Single living intrauterine gestation in cephalic presentation with an estimated gestational age of [redacted] weeks, 2 days. No acute abnormality. This exam is performed on an emergent basis and does not comprehensively evaluate fetal size, dating, or anatomy; follow-up complete OB US should be considered if further fetal assessment is warranted. Electronically Signed   By: Helyn Numbers M.D.   On: 01/15/2024 22:14    US OB Transvaginal Result Date: 01/15/2024 CLINICAL DATA:  [id number] Preterm uterine contractions [id number] EXAM: LIMITED OBSTETRIC ULTRASOUND AND TRANSVAGINAL OBSTETRIC ULTRASOUND FINDINGS: Number of Fetuses: 1 Heart Rate:  160 bpm Movement: Yes Presentation: Cephalic Placental Location: Posterior Previa: No Amniotic Fluid (Subjective): Within normal limits. Largest vertical pocket 4.7 cm AFI: 16.7 cm BPD:  7.6cm 30w 2d MATERNAL FINDINGS: Cervix:  Closed with a cervical length of 4.0 cm.  No funneling. Uterus/Adnexae:  No abnormality visualized. IMPRESSION: Single living intrauterine gestation in cephalic presentation with an estimated gestational age of [redacted] weeks, 2 days. No acute abnormality. This exam is performed on an emergent basis and does not comprehensively evaluate fetal size, dating, or anatomy; follow-up complete OB US should be considered if further fetal assessment is warranted. Electronically  Signed   By: Helyn Numbers M.D.   On: 01/15/2024 22:14   US OB Limited Result Date: 12/22/2023 CLINICAL DATA:  History of preterm delivery EXAM: LIMITED OBSTETRIC ULTRASOUND AND TRANSVAGINAL OBSTETRIC ULTRASOUND FINDINGS: Number of Fetuses: 1 Heart Rate:  158 bpm Movement: Yes Presentation: Cephalic Placental Location: Posterior Previa: No Amniotic Fluid (Subjective): Within normal limits. AFI: 10.4 cm BPD:  6.96cm 28w 0d MATERNAL FINDINGS: Cervix:  Appears closed.  Cervical length 4.0 cm Uterus/Adnexae:  No abnormality visualized. IMPRESSION: Single intrauterine gestation at sonographic gestational age [redacted] weeks, 0 days. Fetal heart rate 158 beats per minute. This exam is performed on an emergent basis and does not comprehensively evaluate fetal size, dating, or anatomy; follow-up complete OB US should be considered if further fetal assessment is warranted. Electronically Signed   By: Jearld Lesch M.D.   On: 12/22/2023 17:28   US OB Transvaginal Result Date: 12/22/2023 CLINICAL DATA:  History of preterm  delivery EXAM: LIMITED OBSTETRIC ULTRASOUND AND TRANSVAGINAL OBSTETRIC ULTRASOUND FINDINGS: Number of Fetuses: 1 Heart Rate:  158 bpm Movement: Yes Presentation: Cephalic Placental Location: Posterior Previa: No Amniotic Fluid (Subjective): Within normal limits. AFI: 10.4 cm BPD:  6.96cm 28w 0d MATERNAL FINDINGS: Cervix:  Appears closed.  Cervical length 4.0 cm Uterus/Adnexae:  No abnormality visualized. IMPRESSION: Single intrauterine gestation at sonographic gestational age [redacted] weeks, 0 days. Fetal heart rate 158 beats per minute. This exam is performed on an emergent basis and does not comprehensively evaluate fetal size, dating, or anatomy; follow-up complete OB US should be considered if further fetal assessment is warranted. Electronically Signed   By: Jearld Lesch M.D.   On: 12/22/2023 17:28    Assessment and Plan: Patient Active Problem List   Diagnosis Date Noted   Preterm uterine contractions 01/15/2024   Encounter for suspected premature rupture of membranes, with rupture of membranes not found 12/22/2023   High risk pregnancy due to history of preterm labor in second trimester 09/03/2023   Preterm labor third tri w preterm del third tri, fetus 1 09/21/2021   Preterm contractions 09/14/2021   Current pregnancy with history of preterm labor 08/31/2021   History of postpartum hemorrhage, currently pregnant 05/02/2020   History of shoulder dystocia in prior pregnancy, currently pregnant 05/02/2020   Hx of preeclampsia, prior pregnancy, currently pregnant 05/01/2020    1. Admit to obs - Collect fFN, Wet prep, GBS - U/s for cervical length    Cyril Mourning, CNM  Certified Nurse Midwife [place]  [facility]

## 2024-01-15 NOTE — Discharge Summary (Signed)
 Patient ID: Carol Schmitt MRN: [id number] DOB/AGE: [birth year] 24 y.o.  Admit date: 01/15/2024 Discharge date: 01/15/2024  Admission Diagnoses: 23 y.o. [id number] at [redacted]w[redacted]d admitted for Preterm labor.  She presented to L&D with complaints of uterine contractions.   Reports active fetal movement  Contractions: every 10 minutes, started last night, is able to sleep through them LOF/SROM: denies Vaginal bleeding: denies Fetal presentation is cephalic   Discharge Diagnoses: Not sign of active cervical change  Factors complicating pregnancy: Anemia  Hx of preterm delivery with G2 at 32wks with PTL the same pregnancy at 29wks Hx of shoulder dystocia with G1, weighed 2.94g Hx of pre-eclampsia with G1 at 39wks  Hx of postpartum hemorrhage with G1  Prenatal Procedures: NST  Consults: None  Significant Diagnostic Studies:  Results for orders placed or performed during the hospital encounter of 01/15/24 (from the past week)  Wet prep, genital   Collection Time: 01/15/24  8:13 PM  Result Value Ref Range   Yeast Wet Prep HPF POC NONE SEEN NONE SEEN   Trich, Wet Prep NONE SEEN NONE SEEN   Clue Cells Wet Prep HPF POC NONE SEEN NONE SEEN   WBC, Wet Prep HPF POC <10 <10   Sperm NONE SEEN   Group B strep by PCR   Collection Time: 01/15/24  8:13 PM   Specimen: Vaginal/Rectal; Genital  Result Value Ref Range   Group B strep by PCR PRESUMPTIVE NEGATIVE PRESUMPTIVE NEGATIVE  Fetal fibronectin   Collection Time: 01/15/24  8:13 PM  Result Value Ref Range   Fetal Fibronectin NEGATIVE NEGATIVE    Treatments: none  Hospital Course:  This is a 23 y.o. [id number] with IUP at [redacted]w[redacted]d seen for preterm UCs, noted to have a cervical exam of closed/thick/high.  No leaking of fluid and no bleeding.  Labs were negative including fFN.  Cervix was noted to be 4.0cm on u/s with no funneling.  She was observed, fetal heart rate monitoring remained reassuring, and she had no signs/symptoms of progressing preterm  labor or other maternal-fetal concerns.  Pt reports no increase in intensity of her contractions and said she is unable to stay over night for extended monitoring due to childcare responsibilities.  Pt was strongly encouraged to stay overnight for observation, PTL precautions discussed and pt was told to call the office tomorrow to make an appointment for this week for follow up.  She was deemed stable for discharge to home with outpatient follow up.  Discharge Physical Exam:  BP 106/65   Temp 98.1 F (36.7 C) (Oral)   LMP 04/29/2023   General: NAD CV: RRR Pulm: nl effort ABD: s/nd/nt, gravid DVT Evaluation: LE non-ttp, no evidence of DVT on exam.  NST: FHR baseline: 145 bpm Variability: moderate Accelerations: yes Decelerations: none Category/reactivity: reactive Amniotic Fluid Index: 16.7 cm  TOCO: quiet SVE:  Dilation: Closed Effacement (%): Thick Station: Ballotable Exam by:: Annamary Rummage CNM   Discharge Condition: Stable  Disposition:  Discharge disposition: 01-Home or Self Care        Allergies as of 01/15/2024       Reactions   Apple Juice Other (See Comments)   Mouth discomfort        Medication List     TAKE these medications    acetaminophen 500 MG tablet Commonly known as: TYLENOL Take 2 tablets (1,000 mg total) by mouth every 6 (six) hours as needed for moderate pain, fever, headache or mild pain (for pain scale < 4  OR  temperature  >/=  100.5 F).   EPINEPHrine 0.3 mg/0.3 mL Soaj injection Commonly known as: EpiPen 2-Pak Inject 0.3 mg into the muscle as needed for anaphylaxis.   multivitamin-prenatal 27-0.8 MG Tabs tablet Take 1 tablet by mouth daily at 12 noon.         SignedHaroldine Laws, CNM 01/15/2024 10:48 PM

## 2024-01-15 NOTE — OB Triage Note (Signed)
 Patient is a  23 yo, GP, at 32 weeks 2 days. Patient presents with complaints of CTX.  Patient denies any vaginal bleeding or LOF outside of small pieces of mucus. Patient reports +FM. Monitors applied and assessing. VSS. Initial fetal heart tone 150 bpm. [place], CNM notified of patients arrival to unit. Plan to complete labs

## 2024-01-22 ENCOUNTER — Observation Stay
Admission: EM | Admit: 2024-01-22 | Discharge: 2024-01-22 | Disposition: A | Discharge status: Home / Self Care | Attending: Obstetrics and Gynecology | Admitting: Obstetrics and Gynecology

## 2024-01-22 DIAGNOSIS — O09299 Supervision of pregnancy with other poor reproductive or obstetric history, unspecified trimester: Principal | ICD-10-CM

## 2024-01-22 DIAGNOSIS — O219 Vomiting of pregnancy, unspecified: Secondary | ICD-10-CM | POA: Diagnosis present

## 2024-01-22 DIAGNOSIS — O4703 False labor before 37 completed weeks of gestation, third trimester: Principal | ICD-10-CM | POA: Insufficient documentation

## 2024-01-22 DIAGNOSIS — O09213 Supervision of pregnancy with history of pre-term labor, third trimester: Secondary | ICD-10-CM

## 2024-01-22 DIAGNOSIS — Z3A33 33 weeks gestation of pregnancy: Secondary | ICD-10-CM | POA: Diagnosis not present

## 2024-01-22 DIAGNOSIS — Z0371 Encounter for suspected problem with amniotic cavity and membrane ruled out: Secondary | ICD-10-CM

## 2024-01-22 LAB — COMPREHENSIVE METABOLIC PANEL WITH GFR
ALT: 12 U/L (ref 0–44)
AST: 21 U/L (ref 15–41)
Albumin: 3 g/dL — ABNORMAL LOW (ref 3.5–5.0)
Alkaline Phosphatase: 80 U/L (ref 38–126)
Anion gap: 9 (ref 5–15)
BUN: 9 mg/dL (ref 6–20)
CO2: 19 mmol/L — ABNORMAL LOW (ref 22–32)
Calcium: 9 mg/dL (ref 8.9–10.3)
Chloride: 106 mmol/L (ref 98–111)
Creatinine, Ser: 0.56 mg/dL (ref 0.44–1.00)
GFR, Estimated: >60 mL/min (ref >60)
Glucose, Bld: 108 mg/dL — ABNORMAL HIGH (ref 70–99)
Potassium: 3.6 mmol/L (ref 3.5–5.1)
Sodium: 134 mmol/L — ABNORMAL LOW (ref 135–145)
Total Bilirubin: 0.7 mg/dL (ref 0.0–1.2)
Total Protein: 6.7 g/dL (ref 6.5–8.1)

## 2024-01-22 LAB — URINALYSIS, ROUTINE W REFLEX MICROSCOPIC
Bilirubin Urine: NEGATIVE
Glucose, UA: NEGATIVE mg/dL
Hgb urine dipstick: NEGATIVE
Ketones, ur: 5 mg/dL — AB
Nitrite: NEGATIVE
Protein, ur: 30 mg/dL — AB
Specific Gravity, Urine: 1.021 (ref 1.005–1.030)
pH: 5 (ref 5.0–8.0)

## 2024-01-22 LAB — CBC
HCT: 30.3 % — ABNORMAL LOW (ref 36.0–46.0)
Hemoglobin: 10.2 g/dL — ABNORMAL LOW (ref 12.0–15.0)
MCH: 28.7 pg (ref 26.0–34.0)
MCHC: 33.7 g/dL (ref 30.0–36.0)
MCV: 85.4 fL (ref 80.0–100.0)
Platelets: 162 10*3/uL (ref 150–400)
RBC: 3.55 MIL/uL — ABNORMAL LOW (ref 3.87–5.11)
RDW: 11.9 % (ref 11.5–15.5)
WBC: 5.8 10*3/uL (ref 4.0–10.5)
nRBC: 0 % (ref 0.0–0.2)

## 2024-01-22 LAB — WET PREP, GENITAL
Clue Cells Wet Prep HPF POC: NONE SEEN
Sperm: NONE SEEN
Trich, Wet Prep: NONE SEEN
WBC, Wet Prep HPF POC: >=10 — AB (ref <10)
Yeast Wet Prep HPF POC: NONE SEEN

## 2024-01-22 LAB — CHLAMYDIA/NGC RT PCR (ARMC ONLY)
Chlamydia Tr: NOT DETECTED
N gonorrhoeae: NOT DETECTED

## 2024-01-22 LAB — LIPASE, BLOOD: Lipase: 30 U/L (ref 11–51)

## 2024-01-22 MED ORDER — LACTATED RINGERS IV BOLUS
1000.0000 mL | Freq: Once | INTRAVENOUS | Status: AC
  Administered 2024-01-22: 1000 mL via INTRAVENOUS

## 2024-01-22 MED ORDER — DOCUSATE SODIUM 100 MG PO CAPS: 100.0000 mg | ORAL_CAPSULE | Freq: Every day | ORAL | Status: DC

## 2024-01-22 MED ORDER — LACTATED RINGERS IV BOLUS: 1000.0000 mL | Freq: Once | INTRAVENOUS | Status: DC

## 2024-01-22 MED ORDER — TERBUTALINE SULFATE 1 MG/ML IJ SOLN: 0.2500 mg | Freq: Once | INTRAMUSCULAR | Status: DC | PRN

## 2024-01-22 MED ORDER — ZOLPIDEM TARTRATE 5 MG PO TABS: 5.0000 mg | ORAL_TABLET | Freq: Every evening | ORAL | Status: DC | PRN

## 2024-01-22 MED ORDER — FAMOTIDINE 20 MG PO TABS
ORAL_TABLET | ORAL | Status: AC
  Filled 2024-01-22: qty 1

## 2024-01-22 MED ORDER — FAMOTIDINE 20 MG PO TABS
20.0000 mg | ORAL_TABLET | Freq: Once | ORAL | Status: AC
  Administered 2024-01-22: 20 mg via ORAL

## 2024-01-22 MED ORDER — ACETAMINOPHEN 500 MG PO TABS: 1000.0000 mg | ORAL_TABLET | Freq: Four times a day (QID) | ORAL | Status: DC | PRN

## 2024-01-22 MED ORDER — FAMOTIDINE IN NACL 20-0.9 MG/50ML-% IV SOLN: 20.0000 mg | Freq: Once | INTRAVENOUS | Status: DC

## 2024-01-22 MED ORDER — CALCIUM CARBONATE ANTACID 500 MG PO CHEW: 2.0000 | CHEWABLE_TABLET | ORAL | Status: DC | PRN

## 2024-01-22 NOTE — Discharge Summary (Signed)
 Carol Schmitt is a 23 y.o. female. She is at [redacted]w[redacted]d gestation. Patient's last menstrual period was 04/29/2023. Estimated Date of Delivery: 03/09/24  Prenatal care site: [facility]  Chief complaint: N/V/D and intermittent uterine contractions  HPI: Carol Schmitt presents to L&D with complaints of  N/V/D and intermittent uterine contractions.Pt reports her kids have had a stomach bug. +FM. Reports losing her mucous plug. Denies bleeding. VSS. Reports ctx started yesterday and got worse during the night. Ctx have improved today. Pt reports no vomit today but still nausea. Pt reports she feels SOB with ctx.   Factors complicating pregnancy: Patient Active Problem List   Diagnosis Date Noted   Nausea and vomiting during pregnancy 01/22/2024   Preterm uterine contractions 01/15/2024   Encounter for suspected premature rupture of membranes, with rupture of membranes not found 12/22/2023   High risk pregnancy due to history of preterm labor in second trimester 09/03/2023   Preterm labor third tri w preterm del third tri, fetus 1 09/21/2021   Preterm contractions 09/14/2021   Current pregnancy with history of preterm labor 08/31/2021   History of postpartum hemorrhage, currently pregnant 05/02/2020   History of shoulder dystocia in prior pregnancy, currently pregnant 05/02/2020   Hx of preeclampsia, prior pregnancy, currently pregnant 05/01/2020     S: Resting comfortably.  no VB.no LOF,  Active fetal movement.   Maternal Medical History:  Past Medical Hx:  has a past medical history of Anemia, Chlamydia, Headache, Preterm labor, and UTI (urinary tract infection).    Past Surgical Hx:  has a past surgical history that includes Therapeutic abortion.   Allergies  Allergen Reactions   Apple Juice Other (See Comments)    Mouth discomfort     Prior to Admission medications   Medication Sig Start Date End Date Taking? Authorizing Provider  Prenatal Vit-Fe Fumarate-FA  (MULTIVITAMIN-PRENATAL) 27-0.8 MG TABS tablet Take 1 tablet by mouth daily at 12 noon.   Yes [provider]  acetaminophen (TYLENOL) 500 MG tablet Take 2 tablets (1,000 mg total) by mouth every 6 (six) hours as needed for moderate pain, fever, headache or mild pain (for pain scale < 4  OR  temperature  >/=  100.5 F). Patient not taking: Reported on 12/22/2023 06/03/23   Katrinka Blazing, [place], CNM  EPINEPHrine (EPIPEN 2-PAK) 0.3 mg/0.3 mL IJ SOAJ injection Inject 0.3 mg into the muscle as needed for anaphylaxis. 05/20/21   Merwyn Katos, MD    Social History: She  reports that she has never smoked. She has never used smokeless tobacco. She reports that she does not currently use alcohol. She reports that she does not currently use drugs after having used the following drugs: Marijuana.  Family History: family history includes Diabetes in her mother; Heart disease in her mother; Other in her father. ,no history of gyn cancers  Review of Systems: A full review of systems was performed and negative except as noted in the HPI.    O:  BP 112/77 (BP Location: Right Arm)   Pulse 75   Temp 98.4 F (36.9 C) (Oral)   Resp 16   Ht 5' (1.524 m)   Wt 66.7 kg   LMP 04/29/2023   SpO2 100%   BMI 28.71 kg/m  Results for orders placed or performed during the hospital encounter of 01/22/24 (from the past 48 hours)  Lipase, blood   Collection Time: 01/22/24  4:14 PM  Result Value Ref Range   Lipase 30 11 - 51 U/L  Comprehensive metabolic panel   Collection Time: 01/22/24  4:14 PM  Result Value Ref Range   Sodium 134 (L) 135 - 145 mmol/L   Potassium 3.6 3.5 - 5.1 mmol/L   Chloride 106 98 - 111 mmol/L   CO2 19 (L) 22 - 32 mmol/L   Glucose, Bld 108 (H) 70 - 99 mg/dL   BUN 9 6 - 20 mg/dL   Creatinine, Ser 9.52 0.44 - 1.00 mg/dL   Calcium 9.0 8.9 - 84.1 mg/dL   Total Protein 6.7 6.5 - 8.1 g/dL   Albumin 3.0 (L) 3.5 - 5.0 g/dL   AST 21 15 - 41 U/L   ALT 12 0 - 44 U/L   Alkaline Phosphatase 80 38 -  126 U/L   Total Bilirubin 0.7 0.0 - 1.2 mg/dL   GFR, Estimated >32 >44 mL/min   Anion gap 9 5 - 15  CBC   Collection Time: 01/22/24  4:14 PM  Result Value Ref Range   WBC 5.8 4.0 - 10.5 K/uL   RBC 3.55 (L) 3.87 - 5.11 MIL/uL   Hemoglobin 10.2 (L) 12.0 - 15.0 g/dL   HCT 01.0 (L) 27.2 - 53.6 %   MCV 85.4 80.0 - 100.0 fL   MCH 28.7 26.0 - 34.0 pg   MCHC 33.7 30.0 - 36.0 g/dL   RDW 64.4 03.4 - 74.2 %   Platelets 162 150 - 400 K/uL   nRBC 0.0 0.0 - 0.2 %  Urinalysis, Routine w reflex microscopic -Urine, Clean Catch   Collection Time: 01/22/24  5:41 PM  Result Value Ref Range   Color, Urine YELLOW (A) YELLOW   APPearance HAZY (A) CLEAR   Specific Gravity, Urine 1.021 1.005 - 1.030   pH 5.0 5.0 - 8.0   Glucose, UA NEGATIVE NEGATIVE mg/dL   Hgb urine dipstick NEGATIVE NEGATIVE   Bilirubin Urine NEGATIVE NEGATIVE   Ketones, ur 5 (A) NEGATIVE mg/dL   Protein, ur 30 (A) NEGATIVE mg/dL   Nitrite NEGATIVE NEGATIVE   Leukocytes,Ua MODERATE (A) NEGATIVE   RBC / HPF 0-5 0 - 5 RBC/hpf   WBC, UA 6-10 0 - 5 WBC/hpf   Bacteria, UA RARE (A) NONE SEEN   Squamous Epithelial / HPF 6-10 0 - 5 /HPF   Mucus PRESENT   Wet prep, genital   Collection Time: 01/22/24  6:26 PM  Result Value Ref Range   Yeast Wet Prep HPF POC NONE SEEN NONE SEEN   Trich, Wet Prep NONE SEEN NONE SEEN   Clue Cells Wet Prep HPF POC NONE SEEN NONE SEEN   WBC, Wet Prep HPF POC >=10 (A) <10   Sperm NONE SEEN   Chlamydia/NGC rt PCR ([facility] only)   Collection Time: 01/22/24  6:26 PM   Specimen: Cervical/Vaginal swab  Result Value Ref Range   Specimen source GC/Chlam ENDOCERVICAL    Chlamydia Tr NOT DETECTED NOT DETECTED   N gonorrhoeae NOT DETECTED NOT DETECTED     Constitutional: NAD, AAOx3  HE/ENT: extraocular movements grossly intact, moist mucous membranes CV: RRR PULM: nl respiratory effort, CTABL Abd: gravid, non-tender, non-distended, soft  Ext: Non-tender, Nonedmeatous Psych: mood appropriate, speech  normal Pelvic : deferred SVE: Dilation: Fingertip Effacement (%): Thick Cervical Position: Posterior Exam by:: Katharina Caper CNM   NST: Baseline FHR: 145 beats/min Variability: moderate Accelerations: present Decelerations: absent Tocometry: every 10-11 min Time: at least 20 minutes   Interpretation: Category I INDICATIONS: rule out uterine contractions RESULTS:  A NST procedure was performed with FHR  monitoring and a normal baseline established, appropriate time of 20-40 minutes of evaluation, and accels >2 seen w 15x15 characteristics.  Results show a REACTIVE NST.    Imaging Studies: US OB Limited Result Date: 01/15/2024 CLINICAL DATA:  [id number] Preterm uterine contractions [id number] EXAM: LIMITED OBSTETRIC ULTRASOUND AND TRANSVAGINAL OBSTETRIC ULTRASOUND FINDINGS: Number of Fetuses: 1 Heart Rate:  160 bpm Movement: Yes Presentation: Cephalic Placental Location: Posterior Previa: No Amniotic Fluid (Subjective): Within normal limits. Largest vertical pocket 4.7 cm AFI: 16.7 cm BPD:  7.6cm 30w 2d MATERNAL FINDINGS: Cervix:  Closed with a cervical length of 4.0 cm.  No funneling. Uterus/Adnexae:  No abnormality visualized. IMPRESSION: Single living intrauterine gestation in cephalic presentation with an estimated gestational age of [redacted] weeks, 2 days. No acute abnormality. This exam is performed on an emergent basis and does not comprehensively evaluate fetal size, dating, or anatomy; follow-up complete OB US should be considered if further fetal assessment is warranted. Electronically Signed   By: Helyn Numbers M.D.   On: 01/15/2024 22:14   US OB Transvaginal Result Date: 01/15/2024 CLINICAL DATA:  [id number] Preterm uterine contractions [id number] EXAM: LIMITED OBSTETRIC ULTRASOUND AND TRANSVAGINAL OBSTETRIC ULTRASOUND FINDINGS: Number of Fetuses: 1 Heart Rate:  160 bpm Movement: Yes Presentation: Cephalic Placental Location: Posterior Previa: No Amniotic Fluid (Subjective): Within normal limits. Largest  vertical pocket 4.7 cm AFI: 16.7 cm BPD:  7.6cm 30w 2d MATERNAL FINDINGS: Cervix:  Closed with a cervical length of 4.0 cm.  No funneling. Uterus/Adnexae:  No abnormality visualized. IMPRESSION: Single living intrauterine gestation in cephalic presentation with an estimated gestational age of [redacted] weeks, 2 days. No acute abnormality. This exam is performed on an emergent basis and does not comprehensively evaluate fetal size, dating, or anatomy; follow-up complete OB US should be considered if further fetal assessment is warranted. Electronically Signed   By: Helyn Numbers M.D.   On: 01/15/2024 22:14    Assessment: 23 y.o. [redacted]w[redacted]d here for antenatal surveillance during pregnancy. Patient asking to eat upon admission, denies N/V/D today. She has been able to eat and keep food down without issues.she reports SOB with pulse ox at 100% she was contracting every 10 minutes, but has had no cervical change in 3.5 hours.  Principle diagnosis:  The primary encounter diagnosis was Hx of preeclampsia, prior pregnancy, currently pregnant. Diagnoses of History of postpartum hemorrhage, currently pregnant, History of shoulder dystocia in prior pregnancy, currently pregnant, Current pregnancy with history of pre-term labor in third trimester, and Encounter for suspected premature rupture of membranes, with rupture of membranes not found were also pertinent to this visit.   Plan: Labor: not present.  Fetal Wellbeing: Reassuring Cat 1 tracing. Reactive NST  IV fluid bolus Wet prep- neg UA +bacterial and leuks D/c home stable, precautions reviewed, follow-up as scheduled.   ----- Chari Manning, CNM Certified Nurse Midwife [facility]

## 2024-01-22 NOTE — OB Triage Note (Addendum)
 Pt G4P2 at [redacted]w[redacted]d presents for ctx. Pt reports N/V/D started late evening on 4/6. Pt reports her kids have had a stomach bug. +FM. Reports losing her mucous plug. Denies bleeding. VSS. Reports ctx started yesterday and got worse during the night. Ctx have improved today. Pt reports no vomit today but still nausea. Pt reports she feels SOB with ctx. Pulse ox applied.

## 2024-01-22 NOTE — ED Notes (Signed)
 On arrival to ED pt was asked what she was being seen for, pt endorses she was being seen "because she needed IV fluids". After pt had been waiting in the waiting room, the person with pt presented to desk stating pt was having contractions, this specific question was asked to pt on arrival.

## 2024-01-22 NOTE — ED Triage Notes (Signed)
 Pt presents to the ED POV from home for N/V/D x2 days. Pt is [redacted] weeks pregnant. Pt reports bilateral lower back pain. Pt was sent here by OB for fluids.

## 2024-01-25 DIAGNOSIS — O99013 Anemia complicating pregnancy, third trimester: Secondary | ICD-10-CM | POA: Diagnosis present

## 2024-01-28 ENCOUNTER — Observation Stay
Admission: EM | Admit: 2024-01-28 | Discharge: 2024-01-28 | Disposition: A | Discharge status: Home / Self Care | Attending: Obstetrics and Gynecology | Admitting: Obstetrics and Gynecology

## 2024-01-28 DIAGNOSIS — O26893 Other specified pregnancy related conditions, third trimester: Secondary | ICD-10-CM | POA: Diagnosis not present

## 2024-01-28 DIAGNOSIS — Z3A34 34 weeks gestation of pregnancy: Secondary | ICD-10-CM | POA: Insufficient documentation

## 2024-01-28 DIAGNOSIS — O26899 Other specified pregnancy related conditions, unspecified trimester: Principal | ICD-10-CM | POA: Diagnosis present

## 2024-01-28 DIAGNOSIS — R079 Chest pain, unspecified: Secondary | ICD-10-CM | POA: Diagnosis not present

## 2024-01-28 DIAGNOSIS — O4703 False labor before 37 completed weeks of gestation, third trimester: Secondary | ICD-10-CM | POA: Diagnosis present

## 2024-01-28 LAB — PROTEIN / CREATININE RATIO, URINE
Creatinine, Urine: 51 mg/dL
Total Protein, Urine: <6 mg/dL

## 2024-01-28 LAB — COMPREHENSIVE METABOLIC PANEL WITH GFR
ALT: 16 U/L (ref 0–44)
AST: 20 U/L (ref 15–41)
Albumin: 2.6 g/dL — ABNORMAL LOW (ref 3.5–5.0)
Alkaline Phosphatase: 83 U/L (ref 38–126)
Anion gap: 7 (ref 5–15)
BUN: 6 mg/dL (ref 6–20)
CO2: 22 mmol/L (ref 22–32)
Calcium: 8.7 mg/dL — ABNORMAL LOW (ref 8.9–10.3)
Chloride: 105 mmol/L (ref 98–111)
Creatinine, Ser: 0.62 mg/dL (ref 0.44–1.00)
GFR, Estimated: >60 mL/min (ref >60)
Glucose, Bld: 99 mg/dL (ref 70–99)
Potassium: 3.3 mmol/L — ABNORMAL LOW (ref 3.5–5.1)
Sodium: 134 mmol/L — ABNORMAL LOW (ref 135–145)
Total Bilirubin: 0.5 mg/dL (ref 0.0–1.2)
Total Protein: 6.5 g/dL (ref 6.5–8.1)

## 2024-01-28 LAB — CBC
HCT: 27.6 % — ABNORMAL LOW (ref 36.0–46.0)
Hemoglobin: 9.2 g/dL — ABNORMAL LOW (ref 12.0–15.0)
MCH: 28 pg (ref 26.0–34.0)
MCHC: 33.3 g/dL (ref 30.0–36.0)
MCV: 83.9 fL (ref 80.0–100.0)
Platelets: 168 10*3/uL (ref 150–400)
RBC: 3.29 MIL/uL — ABNORMAL LOW (ref 3.87–5.11)
RDW: 12.1 % (ref 11.5–15.5)
WBC: 7.2 10*3/uL (ref 4.0–10.5)
nRBC: 0 % (ref 0.0–0.2)

## 2024-01-28 MED ORDER — ACETAMINOPHEN 325 MG PO TABS
650.0000 mg | ORAL_TABLET | ORAL | Status: DC | PRN
Start: 2024-01-28 — End: 2024-01-28

## 2024-01-28 MED ORDER — CALCIUM CARBONATE ANTACID 500 MG PO CHEW: 2.0000 | CHEWABLE_TABLET | ORAL | Status: DC | PRN

## 2024-01-28 MED ORDER — POTASSIUM CHLORIDE 20 MEQ PO PACK
40.0000 meq | PACK | Freq: Once | ORAL | Status: DC
  Filled 2024-01-28: qty 2

## 2024-01-28 NOTE — OB Triage Note (Signed)
 Patient presented to L&D for evaluation with complaints of contractions, swelling in her feet/ tingling of hands and feet, and complaints of chest pain.  States this has been going on for a while, an EKG was performed at her last doctors visit but she hasn't heard anything about it.  Denies vaginal bleeding or leaking of fluid, states she is having a lot of discharge and lost her mucus plug earlier.  Reports normal fetal movements.

## 2024-01-28 NOTE — OB Triage Note (Signed)
Pt discharged home per order.   Pt stable and ambulatory and an After Visit Summary was printed and given to the patient. Discharge education completed with patient/family including follow up instructions, appointments, and medication list. Pt received labor and bleeding precautions. Patient able to verbalize understanding, all questions fully answered upon discharge.   Pt discharged home via personal vehicle with support person.  

## 2024-01-28 NOTE — Discharge Summary (Signed)
 Carol Schmitt is a 23 y.o. female. She is at 109w1d gestation. Patient's last menstrual period was 04/29/2023. Estimated Date of Delivery: 03/09/24  Prenatal care site: [facility]   Current pregnancy complicated by:  - history of PTL at 32wks - hx of shoulder dystocia - hx of postpartum hemorrhage - anemia - history of pre-eclampsia  Chief complaint: preterm uterine contractions, elevated BP, chest pain  She reports uterine contractions today that are every and on one side of her uterus. They are stronger than her typical Braxton Hicks contractions but are not getting closer together.  She reports elevated BP and wants to be evaluated for pre-eclampsia. No headache, changes of vision, and/or RUQ pain.  She reports chest pain that has been occurring for several weeks. It occurs twice daily and lasts for several seconds, mid-sternal. It resolves on its own. She is not experiencing chest pain currently. She was seen for a prenatal visit on 01/25/24 and had an EKG done which has not been read yet. She also was prescribed Protonix for GERD as a cause of the chest pain but reports the chest pain is still occurring. The chest pain is not related to food intake.  S: Resting comfortably. no CTX, no VB.no LOF,  Active fetal movement.  Denies: HA, visual changes, SOB, or RUQ/epigastric pain  Maternal Medical History:   Past Medical History:  Diagnosis Date   Anemia    Chlamydia    Headache    Preterm labor    UTI (urinary tract infection)     Past Surgical History:  Procedure Laterality Date   THERAPEUTIC ABORTION      Allergies  Allergen Reactions   Apple Juice Other (See Comments)    Mouth discomfort    Prior to Admission medications   Medication Sig Start Date End Date Taking? Authorizing Provider  acetaminophen (TYLENOL) 500 MG tablet Take 2 tablets (1,000 mg total) by mouth every 6 (six) hours as needed for moderate pain, fever, headache or mild pain (for  pain scale < 4  OR  temperature  >/=  100.5 F). Patient not taking: Reported on 12/22/2023 06/03/23   Katrinka Blazing, [place], CNM  EPINEPHrine (EPIPEN 2-PAK) 0.3 mg/0.3 mL IJ SOAJ injection Inject 0.3 mg into the muscle as needed for anaphylaxis. 05/20/21   Merwyn Katos, MD  Prenatal Vit-Fe Fumarate-FA (MULTIVITAMIN-PRENATAL) 27-0.8 MG TABS tablet Take 1 tablet by mouth daily at 12 noon.    [provider]     Social History: She  reports that she has never smoked. She has never used smokeless tobacco. She reports that she does not currently use alcohol. She reports that she does not currently use drugs after having used the following drugs: Marijuana.  Family History: family history includes Diabetes in her mother; Heart disease in her mother; Other in her father.  no history of gyn cancers  Review of Systems: A full review of systems was performed and negative except as noted in the HPI.     O:  BP (!) 95/59   Pulse 71   Temp 97.6 F (36.4 C) (Oral)   Resp 16   LMP 04/29/2023  Vitals:   01/28/24 1452 01/28/24 1507 01/28/24 1522 01/28/24 1537  BP: 124/81 110/68 117/79 125/74   01/28/24 1552 01/28/24 1607 01/28/24 1622 01/28/24 1637  BP: 107/76 106/68 100/70 (!) 95/59    Results for orders placed or performed during the hospital encounter of 01/28/24 (from the past 48 hours)  Protein /  creatinine ratio, urine   Collection Time: 01/28/24  3:01 PM  Result Value Ref Range   Creatinine, Urine 51 mg/dL   Total Protein, Urine <6 mg/dL   Protein Creatinine Ratio        0.00 - 0.15 mg/mg[Cre]  Comprehensive metabolic panel   Collection Time: 01/28/24  3:42 PM  Result Value Ref Range   Sodium 134 (L) 135 - 145 mmol/L   Potassium 3.3 (L) 3.5 - 5.1 mmol/L   Chloride 105 98 - 111 mmol/L   CO2 22 22 - 32 mmol/L   Glucose, Bld 99 70 - 99 mg/dL   BUN 6 6 - 20 mg/dL   Creatinine, Ser 1.30 0.44 - 1.00 mg/dL   Calcium 8.7 (L) 8.9 - 10.3 mg/dL   Total Protein 6.5 6.5 - 8.1 g/dL   Albumin  2.6 (L) 3.5 - 5.0 g/dL   AST 20 15 - 41 U/L   ALT 16 0 - 44 U/L   Alkaline Phosphatase 83 38 - 126 U/L   Total Bilirubin 0.5 0.0 - 1.2 mg/dL   GFR, Estimated >86 >57 mL/min   Anion gap 7 5 - 15  CBC   Collection Time: 01/28/24  3:42 PM  Result Value Ref Range   WBC 7.2 4.0 - 10.5 K/uL   RBC 3.29 (L) 3.87 - 5.11 MIL/uL   Hemoglobin 9.2 (L) 12.0 - 15.0 g/dL   HCT 84.6 (L) 96.2 - 95.2 %   MCV 83.9 80.0 - 100.0 fL   MCH 28.0 26.0 - 34.0 pg   MCHC 33.3 30.0 - 36.0 g/dL   RDW 84.1 32.4 - 40.1 %   Platelets 168 150 - 400 K/uL   nRBC 0.0 0.0 - 0.2 %     Constitutional: NAD, AAOx3  HE/ENT: extraocular movements grossly intact, moist mucous membranes CV: RRR PULM: nl respiratory effort, CTABL     Abd: gravid, non-tender, non-distended, soft      Ext: Non-tender, Nonedematous   Psych: mood appropriate, speech normal Pelvic: SVD fingertip/thick/-3  Pelvic exam: normal external genitalia, vulva, vagina, cervix, uterus and adnexa.  Fetal  monitoring: Cat 1 Appropriate for GA Baseline: 135bpm Variability: moderate Accelerations: present x >2 Decelerations absent Time 3.5hrs Uterine contractions q12-60min, palpate mild   A/P: 23 y.o. [redacted]w[redacted]d here for antenatal surveillance for PIH evaluation, chest pain, and preterm uterine contractions  Principle Diagnosis:  Normotensive, chest pain, preterm uterine contractions  Labor: not present. After several hours of observation, cervix is unchanged from previous hospital visit on 01/15/24 and her contractions are irregular and spaced out.  PIH evaluation: pre-eclampsia not present, labs WNL, BP WNL. Chest pain: normal EKG, NSR. HR 71, RRR, SpO2 <95%, easy work of breathing, NAD noted. EKG performed 01/25/24, results pending, with [facility], follow-up with them outpatient. Continue taking Protonix daily for heartburn/GERD. Return to the ER for worsening chest pain.  Fetal Wellbeing: Reassuring Cat 1 tracing. D/c home stable, precautions  reviewed, follow-up as scheduled.    [name], CNM 01/28/2024 6:29 PM

## 2024-02-11 ENCOUNTER — Observation Stay
Admission: EM | Admit: 2024-02-11 | Discharge: 2024-02-11 | Disposition: A | Discharge status: Home / Self Care | Attending: Obstetrics and Gynecology | Admitting: Obstetrics and Gynecology

## 2024-02-11 ENCOUNTER — Other Ambulatory Visit: Payer: Self-pay

## 2024-02-11 ENCOUNTER — Encounter: Payer: Self-pay | Admitting: Obstetrics and Gynecology

## 2024-02-11 ENCOUNTER — Observation Stay

## 2024-02-11 DIAGNOSIS — O479 False labor, unspecified: Secondary | ICD-10-CM | POA: Diagnosis present

## 2024-02-11 DIAGNOSIS — O4703 False labor before 37 completed weeks of gestation, third trimester: Secondary | ICD-10-CM | POA: Diagnosis present

## 2024-02-11 DIAGNOSIS — N920 Excessive and frequent menstruation with regular cycle: Secondary | ICD-10-CM | POA: Diagnosis not present

## 2024-02-11 DIAGNOSIS — O09299 Supervision of pregnancy with other poor reproductive or obstetric history, unspecified trimester: Principal | ICD-10-CM

## 2024-02-11 DIAGNOSIS — Z3A36 36 weeks gestation of pregnancy: Secondary | ICD-10-CM | POA: Insufficient documentation

## 2024-02-11 DIAGNOSIS — Z0371 Encounter for suspected problem with amniotic cavity and membrane ruled out: Secondary | ICD-10-CM

## 2024-02-11 DIAGNOSIS — Z79899 Other long term (current) drug therapy: Secondary | ICD-10-CM | POA: Insufficient documentation

## 2024-02-11 DIAGNOSIS — O26853 Spotting complicating pregnancy, third trimester: Secondary | ICD-10-CM | POA: Insufficient documentation

## 2024-02-11 DIAGNOSIS — O09213 Supervision of pregnancy with history of pre-term labor, third trimester: Secondary | ICD-10-CM

## 2024-02-11 LAB — URINALYSIS, ROUTINE W REFLEX MICROSCOPIC
Bilirubin Urine: NEGATIVE
Glucose, UA: NEGATIVE mg/dL
Hgb urine dipstick: NEGATIVE
Ketones, ur: NEGATIVE mg/dL
Nitrite: NEGATIVE
Protein, ur: NEGATIVE mg/dL
Specific Gravity, Urine: 1.008 (ref 1.005–1.030)
pH: 6 (ref 5.0–8.0)

## 2024-02-11 LAB — CHLAMYDIA/NGC RT PCR (ARMC ONLY)
Chlamydia Tr: NOT DETECTED
N gonorrhoeae: NOT DETECTED

## 2024-02-11 LAB — WET PREP, GENITAL
Sperm: NONE SEEN
Trich, Wet Prep: NONE SEEN
WBC, Wet Prep HPF POC: >=10 — AB (ref <10)
Yeast Wet Prep HPF POC: NONE SEEN

## 2024-02-11 MED ORDER — METRONIDAZOLE 500 MG PO TABS: 500.0000 mg | ORAL_TABLET | Freq: Two times a day (BID) | ORAL | 0 refills | Status: AC

## 2024-02-11 MED ORDER — LACTATED RINGERS IV BOLUS
1000.0000 mL | Freq: Once | INTRAVENOUS | Status: AC
  Administered 2024-02-11: 1000 mL via INTRAVENOUS

## 2024-02-11 MED ORDER — ACETAMINOPHEN 500 MG PO TABS: 1000.0000 mg | ORAL_TABLET | Freq: Once | ORAL | Status: DC

## 2024-02-11 NOTE — Discharge Summary (Signed)
 Carol Schmitt is a 23 y.o. female. She is at [redacted]w[redacted]d gestation. Patient's last menstrual period was 04/29/2023. Estimated Date of Delivery: 03/09/24  Prenatal care site: [facility]  Chief complaint: Uterine Contractions and Spotting in Pregnancy   HPI: Carol Schmitt presents to L&D with concerns of uterine contractions and spotting. She reports she noticed spotting after using the restroom when she wiped. Denies vaginal bleeding and leakage of fluid. Reports active fetal movement.   Factors complicating pregnancy: Anemia H/o preterm delivery with G2 @ 32 weeks H/o shoulder dystocia with G1 H/o pre-eclampsia w/o SF with G1 @ 39 wks H/o postpartum hemorrhage with G1  S: Resting comfortably. No vaginal bleeding and leakage of fluid. Endorses active fetal movement.   Maternal Medical History:  Past Medical Hx:  has a past medical history of Anemia, Chlamydia, Headache, Preterm labor, and UTI (urinary tract infection).    Past Surgical Hx:  has a past surgical history that includes Therapeutic abortion.   Allergies  Allergen Reactions   Apple Juice Other (See Comments)    Mouth discomfort     Prior to Admission medications   Medication Sig Start Date End Date Taking? Authorizing Provider  Prenatal Vit-Fe Fumarate-FA (MULTIVITAMIN-PRENATAL) 27-0.8 MG TABS tablet Take 1 tablet by mouth daily at 12 noon.   Yes [provider]  acetaminophen  (TYLENOL ) 500 MG tablet Take 2 tablets (1,000 mg total) by mouth every 6 (six) hours as needed for moderate pain, fever, headache or mild pain (for pain scale < 4  OR  temperature  >/=  100.5 F). Patient not taking: Reported on 12/22/2023 06/03/23   Felipe Horton, [place] , CNM  EPINEPHrine  (EPIPEN  2-PAK) 0.3 mg/0.3 mL IJ SOAJ injection Inject 0.3 mg into the muscle as needed for anaphylaxis. 05/20/21   Bradler, Evan K, MD    Social History: She  reports that she has never smoked. She has never used smokeless tobacco. She reports that she does not  currently use alcohol. She reports that she does not currently use drugs after having used the following drugs: Marijuana.  Family History: family history includes Diabetes in her mother; Heart disease in her mother; Other in her father.   Review of Systems:  Review of Systems  Constitutional: Negative.   Gastrointestinal:  Positive for abdominal pain (uterine contractions).  Genitourinary: Negative.   Neurological: Negative.   Psychiatric/Behavioral: Negative.      O:  BP 106/78 (BP Location: Left Arm)   Pulse 86   Temp 98 F (36.7 C) (Oral)   Resp 18   Ht 5' (1.524 m)   Wt 66.7 kg   LMP 04/29/2023   BMI 28.72 kg/m  Results for orders placed or performed during the hospital encounter of 02/11/24 (from the past 48 hours)  Wet prep, genital   Collection Time: 02/11/24  2:05 PM   Specimen: Urine, Clean Catch  Result Value Ref Range   Yeast Wet Prep HPF POC NONE SEEN NONE SEEN   Trich, Wet Prep NONE SEEN NONE SEEN   Clue Cells Wet Prep HPF POC PRESENT (A) NONE SEEN   WBC, Wet Prep HPF POC >=10 (A) <10   Sperm NONE SEEN   Chlamydia/NGC rt PCR ([facility] only)   Collection Time: 02/11/24  2:05 PM   Specimen: Urine, Clean Catch  Result Value Ref Range   Specimen source GC/Chlam ENDOCERVICAL    Chlamydia Tr NOT DETECTED NOT DETECTED   N gonorrhoeae NOT DETECTED NOT DETECTED  Urinalysis, Routine w reflex microscopic -Urine, Clean  Catch   Collection Time: 02/11/24  2:05 PM  Result Value Ref Range   Color, Urine YELLOW (A) YELLOW   APPearance HAZY (A) CLEAR   Specific Gravity, Urine 1.008 1.005 - 1.030   pH 6.0 5.0 - 8.0   Glucose, UA NEGATIVE NEGATIVE mg/dL   Hgb urine dipstick NEGATIVE NEGATIVE   Bilirubin Urine NEGATIVE NEGATIVE   Ketones, ur NEGATIVE NEGATIVE mg/dL   Protein, ur NEGATIVE NEGATIVE mg/dL   Nitrite NEGATIVE NEGATIVE   Leukocytes,Ua LARGE (A) NEGATIVE   RBC / HPF 0-5 0 - 5 RBC/hpf   WBC, UA 0-5 0 - 5 WBC/hpf   Bacteria, UA RARE (A) NONE SEEN   Squamous  Epithelial / HPF 6-10 0 - 5 /HPF   Mucus PRESENT      Constitutional: NAD, AAOx3  HE/ENT: extraocular movements grossly intact, moist mucous membranes CV: RRR PULM: nl respiratory effort, CTABL Abd: gravid, non-tender, non-distended, soft  Ext: Non-tender, Nonedmeatous Psych: mood appropriate, speech normal SVE: Dilation: 1 Effacement (%): 40 Cervical Position: Posterior Station: Ballotable Presentation: Vertex Exam by:: Reta Cassis RN   NST:  Baseline: 135 bpm Variability: moderate Accels: Present Decels: none Toco: irregular Category: I Interpretation:  INDICATIONS: patient reassurance and rule out uterine contractions RESULTS:  A NST procedure was performed with FHR monitoring and a normal baseline established, appropriate time of 20-40 minutes of evaluation, and accels >2 seen w 15x15 characteristics.  Results show a REACTIVE NST.   Assessment: 24 y.o. [redacted]w[redacted]d here for antenatal surveillance during pregnancy.  Principle diagnosis: Uterine Contractions and Spotting in pregnancy   Plan:  Uterine Contractions Labor: Not present. US  and toco placed on abdomen. Patient reports mild contractions. Irregular contractions observed on FHR tracing. FHR tracing observed for approximately 5 hours. SVE: 1/40/ballotable. Cervical exam unchanged after approximately 5 hours reassuring against preterm labor. Patient denies leakage of fluid reassuring against rupture of membranes.  Fetal Wellbeing: Reassuring Cat 1 tracing. Reactive NST  LR bolus 1,000 mL given.   - Patient reports she feels better after receiving the LR bolus.  Tylenol  1,000 mg PO offered. Patient declined.  US  OB Limited (02/11/2024)   - Presentation: Cephalic   - AFI: 11.4 cm   - Cervix appears closed.   US  OB Transvaginal   - Cervical length: 4.1 cm   2. Spotting in Pregnancy Wet prep collected (02/11/2024); Results: Positive for bacterial vaginosis   - Prescription for Flagyl  500 mg BID x 7 days sent to preferred                       pharmacy. Patient verbalized understanding and had no further                      questions.  - Education on bacterial vaginosis provided to patient in AVS.   Urinalysis collected (02/11/2024); Results: Large amt of leukocytes  GC/C collected (02/11/2024); Results: Negative   Allergies as of 02/11/2024       Reactions   Apple Juice Other (See Comments)   Mouth discomfort        Medication List     TAKE these medications    acetaminophen  500 MG tablet Commonly known as: TYLENOL  Take 2 tablets (1,000 mg total) by mouth every 6 (six) hours as needed for moderate pain, fever, headache or mild pain (for pain scale < 4  OR  temperature  >/=  100.5 F).   EPINEPHrine  0.3 mg/0.3 mL Soaj injection Commonly  known as: EpiPen  2-Pak Inject 0.3 mg into the muscle as needed for anaphylaxis.   metroNIDAZOLE  500 MG tablet Commonly known as: Flagyl  Take 1 tablet (500 mg total) by mouth 2 (two) times daily for 7 days.   multivitamin-prenatal 27-0.8 MG Tabs tablet Take 1 tablet by mouth daily at 12 noon.        - D/c home stable, strict return precautions reviewed, follow-up as needed.   ----- Sanora Cunanan, CNM Certified Nurse Midwife [place]  [facility]

## 2024-02-11 NOTE — OB Triage Note (Signed)
 Pt being discharged home by Liboon CNM. Flagyl  prescribed, no cervical change noted. Discharge instructions reviewed and pt verbalized understanding. Labor precautions discussed. Pt going home stable and ambulatory.

## 2024-02-11 NOTE — OB Triage Note (Signed)
 23 y.o G4P2 presents to L&D triage w/ c/o cramping and ctx's that began around 0200 today. She also notes light vaginal spotting. Pt reports good fetal movement, denies other symptoms. Liboon CNM aware of pt arrival, monitors applied and assessing. Vitals wnl. Initial cervical exam showed 1/40/Ballotable.

## 2024-02-22 ENCOUNTER — Other Ambulatory Visit: Payer: Self-pay

## 2024-02-22 ENCOUNTER — Observation Stay: Admission: EM | Admit: 2024-02-22 | Discharge: 2024-02-22 | Disposition: A | Discharge status: Home / Self Care

## 2024-02-22 ENCOUNTER — Encounter: Payer: Self-pay | Admitting: Obstetrics and Gynecology

## 2024-02-22 DIAGNOSIS — O471 False labor at or after 37 completed weeks of gestation: Secondary | ICD-10-CM | POA: Diagnosis present

## 2024-02-22 DIAGNOSIS — N920 Excessive and frequent menstruation with regular cycle: Secondary | ICD-10-CM

## 2024-02-22 DIAGNOSIS — B3731 Acute candidiasis of vulva and vagina: Secondary | ICD-10-CM | POA: Diagnosis present

## 2024-02-22 DIAGNOSIS — O09299 Supervision of pregnancy with other poor reproductive or obstetric history, unspecified trimester: Principal | ICD-10-CM

## 2024-02-22 DIAGNOSIS — Z3A37 37 weeks gestation of pregnancy: Secondary | ICD-10-CM | POA: Diagnosis not present

## 2024-02-22 DIAGNOSIS — O23593 Infection of other part of genital tract in pregnancy, third trimester: Secondary | ICD-10-CM | POA: Insufficient documentation

## 2024-02-22 DIAGNOSIS — O479 False labor, unspecified: Secondary | ICD-10-CM | POA: Diagnosis present

## 2024-02-22 DIAGNOSIS — Z0371 Encounter for suspected problem with amniotic cavity and membrane ruled out: Secondary | ICD-10-CM

## 2024-02-22 DIAGNOSIS — O09213 Supervision of pregnancy with history of pre-term labor, third trimester: Secondary | ICD-10-CM

## 2024-02-22 LAB — WET PREP, GENITAL
Clue Cells Wet Prep HPF POC: NONE SEEN
Sperm: NONE SEEN
Trich, Wet Prep: NONE SEEN
WBC, Wet Prep HPF POC: >=10 — AB (ref <10)

## 2024-02-22 LAB — URINALYSIS, COMPLETE (UACMP) WITH MICROSCOPIC
Bacteria, UA: NONE SEEN
Bilirubin Urine: NEGATIVE
Glucose, UA: NEGATIVE mg/dL
Hgb urine dipstick: NEGATIVE
Ketones, ur: 5 mg/dL — AB
Nitrite: NEGATIVE
Protein, ur: NEGATIVE mg/dL
Specific Gravity, Urine: 1.011 (ref 1.005–1.030)
pH: 6 (ref 5.0–8.0)

## 2024-02-22 LAB — CHLAMYDIA/NGC RT PCR (ARMC ONLY)
Chlamydia Tr: NOT DETECTED
N gonorrhoeae: NOT DETECTED

## 2024-02-22 MED ORDER — TERCONAZOLE 80 MG VA SUPP: 80.0000 mg | Freq: Every day | VAGINAL | 0 refills | Status: DC

## 2024-02-22 MED ORDER — ACETAMINOPHEN 500 MG PO TABS: 1000.0000 mg | ORAL_TABLET | Freq: Four times a day (QID) | ORAL | Status: DC | PRN

## 2024-02-22 MED ORDER — CALCIUM CARBONATE ANTACID 500 MG PO CHEW: 2.0000 | CHEWABLE_TABLET | ORAL | Status: DC | PRN

## 2024-02-22 MED ORDER — TERCONAZOLE 0.4 % VA CREA: 1.0000 | TOPICAL_CREAM | Freq: Every day | VAGINAL | 0 refills | Status: DC

## 2024-02-22 NOTE — OB Triage Note (Signed)
 Pt reports to labor and delivery with complaints of contractions and pelvic. States that her vagina "feels heavy." States that contractions started last night but faded when she fell asleep. They came back today around 9am. She is also experiencing pelvic pressure that she rates as 8/10 on a pain scale.Denies LOF and vaginal bleeding. States positive fetal movement.    EFM and toco applied and assessing.

## 2024-02-22 NOTE — Discharge Summary (Cosign Needed Addendum)
 Carol Schmitt is a 23 y.o. female. She is at [redacted]w[redacted]d gestation. Patient's last menstrual period was 04/29/2023. 03/09/2024, by Other Basis   Prenatal care site: [facility]  Chief complaint: contractions and pelvic pressure   Admission Diagnoses:  1) intrauterine pregnancy at [redacted]w[redacted]d  2) Uterine contractions during pregnancy [O47.9]  Discharge Diagnoses:  Principal Problem:   Uterine contractions during pregnancy Active Problems:   Vaginal yeast infection  HPI: [name] presents to L&D with complaints of contractions and pelvic pressure. States her vagina "feels heavy". Reports contractions started yesterday but beame less frequent last night. They started again this morning around 9 am. Her pregnancy is complicated by history of preterm delivery, inadequate prenatal care (last appt at 34 weeks), history of shoulder dystocia.  She denies Loss of fluid or Vaginal bleeding. Endorses fetal movement as active.   S: Resting comfortably. no VB.no LOF,  Active fetal movement.   Maternal Medical History:  Past Medical Hx:  has a past medical history of Anemia, Chlamydia, Headache, Preterm labor, and UTI (urinary tract infection).    Past Surgical Hx:  has a past surgical history that includes Therapeutic abortion.   Allergies  Allergen Reactions   Apple Juice Other (See Comments)    Mouth discomfort     Prior to Admission medications   Medication Sig Start Date End Date Taking? Authorizing Provider  terconazole  (TERAZOL 7 ) 0.4 % vaginal cream Place 1 applicator vaginally at bedtime. 02/22/24  Yes Teodora Fell, CNM  acetaminophen  (TYLENOL ) 500 MG tablet Take 2 tablets (1,000 mg total) by mouth every 6 (six) hours as needed for moderate pain, fever, headache or mild pain (for pain scale < 4  OR  temperature  >/=  100.5 F). Patient not taking: Reported on 12/22/2023 06/03/23   Felipe Horton, [place] , CNM  EPINEPHrine  (EPIPEN  2-PAK) 0.3 mg/0.3 mL IJ SOAJ injection Inject 0.3 mg into the  muscle as needed for anaphylaxis. 05/20/21   Bradler, Evan K, MD  Prenatal Vit-Fe Fumarate-FA (MULTIVITAMIN-PRENATAL) 27-0.8 MG TABS tablet Take 1 tablet by mouth daily at 12 noon.    [provider]    Social History: She  reports that she has never smoked. She has never used smokeless tobacco. She reports that she does not currently use alcohol. She reports that she does not currently use drugs after having used the following drugs: Marijuana.  Family History: family history includes Diabetes in her mother; Heart disease in her mother; Other in her father.   Review of Systems: A full review of systems was performed and negative except as noted in the HPI.     Pertinent Results:   O:  BP 131/83 (BP Location: Right Arm)   Pulse 90   Temp 97.6 F (36.4 C) (Oral)   Resp 18   Ht 5' (1.524 m)   Wt 68.9 kg   LMP 04/29/2023   BMI 29.69 kg/m  Results for orders placed or performed during the hospital encounter of 02/22/24 (from the past 48 hours)  Wet prep, genital   Collection Time: 02/22/24  3:43 PM   Specimen: Vaginal/Rectal  Result Value Ref Range   Yeast Wet Prep HPF POC PRESENT (A) NONE SEEN   Trich, Wet Prep NONE SEEN NONE SEEN   Clue Cells Wet Prep HPF POC NONE SEEN NONE SEEN   WBC, Wet Prep HPF POC >=10 (A) <10   Sperm NONE SEEN   Chlamydia/NGC rt PCR ([facility] only)   Collection Time: 02/22/24  3:43 PM  Specimen: Vaginal/Rectal; Genital  Result Value Ref Range   Specimen source GC/Chlam SWAB    Chlamydia Tr NOT DETECTED NOT DETECTED   N gonorrhoeae NOT DETECTED NOT DETECTED  Urinalysis, Complete w Microscopic -Urine, Clean Catch   Collection Time: 02/22/24  3:43 PM  Result Value Ref Range   Color, Urine YELLOW (A) YELLOW   APPearance HAZY (A) CLEAR   Specific Gravity, Urine 1.011 1.005 - 1.030   pH 6.0 5.0 - 8.0   Glucose, UA NEGATIVE NEGATIVE mg/dL   Hgb urine dipstick NEGATIVE NEGATIVE   Bilirubin Urine NEGATIVE NEGATIVE   Ketones, ur 5 (A) NEGATIVE mg/dL    Protein, ur NEGATIVE NEGATIVE mg/dL   Nitrite NEGATIVE NEGATIVE   Leukocytes,Ua LARGE (A) NEGATIVE   RBC / HPF 0-5 0 - 5 RBC/hpf   WBC, UA 11-20 0 - 5 WBC/hpf   Bacteria, UA NONE SEEN NONE SEEN   Squamous Epithelial / HPF 0-5 0 - 5 /HPF   Mucus PRESENT     US  OB Transvaginal Result Date: 02/11/2024 CLINICAL DATA:  History of preterm labor. Assigned gestational age is 36 weeks 1 day. EXAM: LIMITED OBSTETRIC ULTRASOUND COMPARISON:  01/15/2024 FINDINGS: Number of Fetuses: 1 Heart Rate:  153 bpm Movement: Fetal movement is observed. Presentation: Cephalic presentation. Placental Location: Posterior Previa: None. Amniotic Fluid (Subjective):  Within normal limits. AFI: 11.4 cm BPD: 8.6 cm 34 w  6 d MATERNAL FINDINGS: Cervix: Cervical length measures 4.1 cm. Cervix appears closed but there is a small amount of fluid in the endocervical canal. Uterus/Adnexae: No abnormality visualized. IMPRESSION: Single intrauterine pregnancy in cephalic presentation. No acute complication is demonstrated on limited ultrasound evaluation. Cervix appears closed with cervical length measuring 4.1 cm. This exam is performed on an emergent basis and does not comprehensively evaluate fetal size, dating, or anatomy; follow-up complete OB US  should be considered if further fetal assessment is warranted. Electronically Signed   By: Boyce Byes M.D.   On: 02/11/2024 16:32   US  OB Limited Result Date: 02/11/2024 CLINICAL DATA:  History of preterm labor. Assigned gestational age is 36 weeks 1 day. EXAM: LIMITED OBSTETRIC ULTRASOUND COMPARISON:  01/15/2024 FINDINGS: Number of Fetuses: 1 Heart Rate:  153 bpm Movement: Fetal movement is observed. Presentation: Cephalic presentation. Placental Location: Posterior Previa: None. Amniotic Fluid (Subjective):  Within normal limits. AFI: 11.4 cm BPD: 8.6 cm 34 w  6 d MATERNAL FINDINGS: Cervix: Cervical length measures 4.1 cm. Cervix appears closed but there is a small amount of fluid in  the endocervical canal. Uterus/Adnexae: No abnormality visualized. IMPRESSION: Single intrauterine pregnancy in cephalic presentation. No acute complication is demonstrated on limited ultrasound evaluation. Cervix appears closed with cervical length measuring 4.1 cm. This exam is performed on an emergent basis and does not comprehensively evaluate fetal size, dating, or anatomy; follow-up complete OB US  should be considered if further fetal assessment is warranted. Electronically Signed   By: Boyce Byes M.D.   On: 02/11/2024 16:32    Constitutional: NAD, AAOx3  PULM: nl respiratory effort Abd: gravid, non-tender Ext: Non-tender, Nonedmeatous Psych: mood appropriate, speech normal SVE: Dilation: 1 Effacement (%): 50 Cervical Position: Posterior Station: -2 Presentation: Vertex Exam by:: A Avaree Gilberti, CNM  -Unchanged after > 2 hours   NST: Baseline FHR: 140 beats/min Variability: moderate Accelerations: present Decelerations: absent Tocometry: Irregular, mild contractions  Time: at least 20 minutes   Interpretation: Category I INDICATIONS: rule out uterine contractions RESULTS:  A NST procedure was performed with FHR monitoring and a  normal baseline established, appropriate time of 20-40 minutes of evaluation, and accels >2 seen w 15x15 characteristics.  Results show a REACTIVE NST.   Consults: None  Procedures: NST  Hospital Course: The patient was admitted to Labor and Delivery Triage for observation. Contractions were irregular and mild, cervical exam was unchanged. Swabs for GBS, GC/CT, and wet prep obtained. Wet prep was significant for vaginal yeast infection. Rx for terconazole  was sent to pharmacy. Kenitra discussed desire for elective IOL at 39 weeks. Reviewed risk/benefits and induction methods. IOL scheduled for 03/03/2024 at 8:00 am. Handout provided on when to call L&D. Labor precautions reviewed and when to return to L&D.  She was deemed stable for discharge and further  outpatient management.   Discharge Condition: stable  Disposition: Discharge disposition: 01-Home or Self Care       Allergies as of 02/22/2024       Reactions   Apple Juice Other (See Comments)   Mouth discomfort        Medication List     STOP taking these medications    acetaminophen  500 MG tablet Commonly known as: TYLENOL        TAKE these medications    EPINEPHrine  0.3 mg/0.3 mL Soaj injection Commonly known as: EpiPen  2-Pak Inject 0.3 mg into the muscle as needed for anaphylaxis.   multivitamin-prenatal 27-0.8 MG Tabs tablet Take 1 tablet by mouth daily at 12 noon.   terconazole  0.4 % vaginal cream Commonly known as: TERAZOL 7  Place 1 applicator vaginally at bedtime.        Follow-up Information     [facility]. Schedule an appointment as soon as possible for a visit.   Why: 3-5 days for routine prenatal appointment Contact information: [address] [place]  [address] [phone]               ----- Teodora Fell, CNM  Certified Nurse Midwife [place]  [facility]

## 2024-02-22 NOTE — OB Triage Note (Signed)
Pt discharged home per order.   Pt stable and ambulatory and an After Visit Summary was printed and given to the patient. Discharge education completed with patient/family including follow up instructions, appointments, and medication list. Pt received labor and bleeding precautions. Patient able to verbalize understanding, all questions fully answered upon discharge.   Pt discharged home via personal vehicle with support person.  

## 2024-02-22 NOTE — Discharge Instructions (Signed)
 Induction of labor for [facility]   Induction of labor scheduled for 03/03/2024 at 8:00 am   You have been scheduled for induction of labor on 03/03/2024 at 8:00 am.  Please call Labor and Delivery at [phone] an hour before your induction time (5/19 at 7AM) to make sure that they have bed availability for you.  Please be patient during this time as we cannot control how many babies want to have a birthday at the same time.     LABOR: When contractions begin, you should start to time them from the beginning of one contraction to the beginning of the next.  When contractions are 5-10 minutes apart or less and have been regular for at least an hour, you should call your health care provider.  Notify your doctor if any of the following occur: 1. Bleeding from the vagina 7. Sudden, constant, or occasional abdominal pain  2. Pain or burning when urinating 8. Sudden gushing of fluid from the vagina (with or without continued leaking)  3. Chills or fever 9. Fainting spells, "black outs" or loss of consciousness  4. Increase in vaginal discharge 10. Severe or continued nausea or vomiting  5. Pelvic pressure (sudden increase) 11. Blurring of vision or spots before the eyes  6. Baby moving less than usual 12. Leaking of fluid    FETAL KICK COUNT: Lie on your left side for one hour after a meal, and count the number of times your baby kicks. If it is less than 5 times, get up, move around and drink some juice. Repeat the test 30 minutes later. If it is still less than 5 kicks in an hour, notify your doctor.

## 2024-02-24 LAB — CULTURE, BETA STREP (GROUP B ONLY)

## 2024-03-04 ENCOUNTER — Inpatient Hospital Stay: Admitting: Anesthesiology

## 2024-03-04 ENCOUNTER — Other Ambulatory Visit: Payer: Self-pay

## 2024-03-04 ENCOUNTER — Encounter: Payer: Self-pay | Admitting: Obstetrics and Gynecology

## 2024-03-04 ENCOUNTER — Inpatient Hospital Stay
Admission: EM | Admit: 2024-03-04 | Discharge: 2024-03-07 | DRG: 768 | Disposition: A | Discharge status: Home / Self Care | Attending: Obstetrics | Admitting: Obstetrics

## 2024-03-04 DIAGNOSIS — O163 Unspecified maternal hypertension, third trimester: Secondary | ICD-10-CM | POA: Diagnosis present

## 2024-03-04 DIAGNOSIS — O1404 Mild to moderate pre-eclampsia, complicating childbirth: Secondary | ICD-10-CM | POA: Diagnosis present

## 2024-03-04 DIAGNOSIS — Z833 Family history of diabetes mellitus: Secondary | ICD-10-CM | POA: Diagnosis not present

## 2024-03-04 DIAGNOSIS — O9081 Anemia of the puerperium: Secondary | ICD-10-CM | POA: Diagnosis not present

## 2024-03-04 DIAGNOSIS — Z8249 Family history of ischemic heart disease and other diseases of the circulatory system: Secondary | ICD-10-CM

## 2024-03-04 DIAGNOSIS — Z3A38 38 weeks gestation of pregnancy: Secondary | ICD-10-CM | POA: Diagnosis not present

## 2024-03-04 DIAGNOSIS — N920 Excessive and frequent menstruation with regular cycle: Secondary | ICD-10-CM

## 2024-03-04 DIAGNOSIS — O09299 Supervision of pregnancy with other poor reproductive or obstetric history, unspecified trimester: Principal | ICD-10-CM

## 2024-03-04 DIAGNOSIS — O09213 Supervision of pregnancy with history of pre-term labor, third trimester: Secondary | ICD-10-CM

## 2024-03-04 DIAGNOSIS — O368130 Decreased fetal movements, third trimester, not applicable or unspecified: Secondary | ICD-10-CM | POA: Diagnosis present

## 2024-03-04 DIAGNOSIS — O326XX0 Maternal care for compound presentation, not applicable or unspecified: Secondary | ICD-10-CM | POA: Diagnosis present

## 2024-03-04 DIAGNOSIS — D62 Acute posthemorrhagic anemia: Secondary | ICD-10-CM | POA: Diagnosis not present

## 2024-03-04 DIAGNOSIS — O1493 Unspecified pre-eclampsia, third trimester: Secondary | ICD-10-CM | POA: Diagnosis present

## 2024-03-04 DIAGNOSIS — O09219 Supervision of pregnancy with history of pre-term labor, unspecified trimester: Secondary | ICD-10-CM

## 2024-03-04 DIAGNOSIS — O26893 Other specified pregnancy related conditions, third trimester: Secondary | ICD-10-CM | POA: Diagnosis present

## 2024-03-04 DIAGNOSIS — O99013 Anemia complicating pregnancy, third trimester: Secondary | ICD-10-CM | POA: Diagnosis present

## 2024-03-04 LAB — COMPREHENSIVE METABOLIC PANEL WITH GFR
ALT: 11 U/L (ref 0–44)
AST: 19 U/L (ref 15–41)
Albumin: 2.8 g/dL — ABNORMAL LOW (ref 3.5–5.0)
Alkaline Phosphatase: 113 U/L (ref 38–126)
Anion gap: 8 (ref 5–15)
BUN: 10 mg/dL (ref 6–20)
CO2: 18 mmol/L — ABNORMAL LOW (ref 22–32)
Calcium: 8.7 mg/dL — ABNORMAL LOW (ref 8.9–10.3)
Chloride: 107 mmol/L (ref 98–111)
Creatinine, Ser: 0.6 mg/dL (ref 0.44–1.00)
GFR, Estimated: >60 mL/min (ref >60)
Glucose, Bld: 95 mg/dL (ref 70–99)
Potassium: 3.6 mmol/L (ref 3.5–5.1)
Sodium: 133 mmol/L — ABNORMAL LOW (ref 135–145)
Total Bilirubin: 0.4 mg/dL (ref 0.0–1.2)
Total Protein: 6.5 g/dL (ref 6.5–8.1)

## 2024-03-04 LAB — PROTEIN / CREATININE RATIO, URINE
Creatinine, Urine: 91 mg/dL
Protein Creatinine Ratio: 0.47 mg/mg{creat} — ABNORMAL HIGH (ref 0.00–0.15)
Total Protein, Urine: 43 mg/dL

## 2024-03-04 LAB — CBC
HCT: 29.1 % — ABNORMAL LOW (ref 36.0–46.0)
Hemoglobin: 9.6 g/dL — ABNORMAL LOW (ref 12.0–15.0)
MCH: 27 pg (ref 26.0–34.0)
MCHC: 33 g/dL (ref 30.0–36.0)
MCV: 81.7 fL (ref 80.0–100.0)
Platelets: 162 10*3/uL (ref 150–400)
RBC: 3.56 MIL/uL — ABNORMAL LOW (ref 3.87–5.11)
RDW: 14.6 % (ref 11.5–15.5)
WBC: 8.8 10*3/uL (ref 4.0–10.5)
nRBC: 0.2 % (ref 0.0–0.2)

## 2024-03-04 LAB — TYPE AND SCREEN
ABO/RH(D): B POS
Antibody Screen: NEGATIVE

## 2024-03-04 LAB — RPR: RPR Ser Ql: NONREACTIVE

## 2024-03-04 MED ORDER — LACTATED RINGERS IV SOLN: INTRAVENOUS | Status: DC

## 2024-03-04 MED ORDER — MISOPROSTOL 25 MCG QUARTER TABLET
ORAL_TABLET | ORAL | Status: AC
  Administered 2024-03-04: 25 ug via ORAL
  Filled 2024-03-04: qty 1

## 2024-03-04 MED ORDER — AMMONIA AROMATIC IN INHA
RESPIRATORY_TRACT | Status: AC
  Filled 2024-03-04: qty 10

## 2024-03-04 MED ORDER — LACTATED RINGERS IV SOLN: 500.0000 mL | INTRAVENOUS | Status: DC | PRN

## 2024-03-04 MED ORDER — MISOPROSTOL 50MCG HALF TABLET: 50.0000 ug | ORAL_TABLET | Freq: Once | ORAL | Status: DC

## 2024-03-04 MED ORDER — CALCIUM CARBONATE ANTACID 500 MG PO CHEW
2.0000 | CHEWABLE_TABLET | ORAL | Status: DC | PRN
  Administered 2024-03-04 – 2024-03-05 (×2): 400 mg via ORAL
  Filled 2024-03-04 (×2): qty 2

## 2024-03-04 MED ORDER — FENTANYL CITRATE (PF) 100 MCG/2ML IJ SOLN
50.0000 ug | INTRAMUSCULAR | Status: DC | PRN
  Administered 2024-03-05: 100 ug via INTRAVENOUS
  Filled 2024-03-04: qty 2

## 2024-03-04 MED ORDER — MISOPROSTOL 25 MCG QUARTER TABLET
ORAL_TABLET | ORAL | Status: AC
  Filled 2024-03-04: qty 2

## 2024-03-04 MED ORDER — MISOPROSTOL 25 MCG QUARTER TABLET: 25.0000 ug | ORAL_TABLET | Freq: Once | ORAL | Status: AC

## 2024-03-04 MED ORDER — LACTATED RINGERS IV SOLN
500.0000 mL | Freq: Once | INTRAVENOUS | Status: AC
  Administered 2024-03-05: 500 mL via INTRAVENOUS

## 2024-03-04 MED ORDER — MISOPROSTOL 25 MCG QUARTER TABLET
25.0000 ug | ORAL_TABLET | ORAL | Status: AC
  Administered 2024-03-04 (×2): 25 ug via ORAL
  Filled 2024-03-04: qty 1

## 2024-03-04 MED ORDER — SOD CITRATE-CITRIC ACID 500-334 MG/5ML PO SOLN: 30.0000 mL | ORAL | Status: DC | PRN

## 2024-03-04 MED ORDER — OXYTOCIN 10 UNIT/ML IJ SOLN
INTRAMUSCULAR | Status: AC
  Filled 2024-03-04: qty 2

## 2024-03-04 MED ORDER — EPHEDRINE 5 MG/ML INJ: 10.0000 mg | INTRAVENOUS | Status: DC | PRN

## 2024-03-04 MED ORDER — ACETAMINOPHEN 500 MG PO TABS: 1000.0000 mg | ORAL_TABLET | Freq: Four times a day (QID) | ORAL | Status: DC | PRN

## 2024-03-04 MED ORDER — OXYTOCIN-SODIUM CHLORIDE 30-0.9 UT/500ML-% IV SOLN
1.0000 m[IU]/min | INTRAVENOUS | Status: DC
  Administered 2024-03-04: 2 m[IU]/min via INTRAVENOUS
  Filled 2024-03-04: qty 500

## 2024-03-04 MED ORDER — MISOPROSTOL 200 MCG PO TABS
ORAL_TABLET | ORAL | Status: AC
Start: 2024-03-04 — End: 2024-03-04
  Filled 2024-03-04: qty 4

## 2024-03-04 MED ORDER — TERBUTALINE SULFATE 1 MG/ML IJ SOLN: 0.2500 mg | Freq: Once | INTRAMUSCULAR | Status: DC | PRN

## 2024-03-04 MED ORDER — OXYTOCIN-SODIUM CHLORIDE 30-0.9 UT/500ML-% IV SOLN
2.5000 [IU]/h | INTRAVENOUS | Status: DC
  Filled 2024-03-04: qty 500

## 2024-03-04 MED ORDER — MISOPROSTOL 25 MCG QUARTER TABLET
25.0000 ug | ORAL_TABLET | Freq: Once | ORAL | Status: DC
  Filled 2024-03-04: qty 1

## 2024-03-04 MED ORDER — SODIUM CHLORIDE 0.9% FLUSH: 3.0000 mL | INTRAVENOUS | Status: DC | PRN

## 2024-03-04 MED ORDER — ONDANSETRON HCL 4 MG/2ML IJ SOLN
4.0000 mg | Freq: Four times a day (QID) | INTRAMUSCULAR | Status: DC | PRN
  Administered 2024-03-04: 4 mg via INTRAVENOUS
  Filled 2024-03-04: qty 2

## 2024-03-04 MED ORDER — SODIUM CHLORIDE 0.9 % IV SOLN: 250.0000 mL | INTRAVENOUS | Status: DC | PRN

## 2024-03-04 MED ORDER — MISOPROSTOL 25 MCG QUARTER TABLET
25.0000 ug | ORAL_TABLET | ORAL | Status: AC
  Administered 2024-03-04 (×2): 25 ug via VAGINAL
  Filled 2024-03-04: qty 1

## 2024-03-04 MED ORDER — FENTANYL-BUPIVACAINE-NACL 0.5-0.125-0.9 MG/250ML-% EP SOLN
EPIDURAL | Status: AC
  Filled 2024-03-04: qty 250

## 2024-03-04 MED ORDER — LIDOCAINE HCL (PF) 1 % IJ SOLN
30.0000 mL | INTRAMUSCULAR | Status: DC | PRN
  Filled 2024-03-04: qty 30

## 2024-03-04 MED ORDER — OXYTOCIN BOLUS FROM INFUSION
333.0000 mL | Freq: Once | INTRAVENOUS | Status: AC
  Administered 2024-03-05: 333 mL via INTRAVENOUS

## 2024-03-04 MED ORDER — DIPHENHYDRAMINE HCL 50 MG/ML IJ SOLN: 12.5000 mg | INTRAMUSCULAR | Status: DC | PRN

## 2024-03-04 MED ORDER — FENTANYL-BUPIVACAINE-NACL 0.5-0.125-0.9 MG/250ML-% EP SOLN
12.0000 mL/h | EPIDURAL | Status: DC | PRN
  Administered 2024-03-05: 12 mL/h via EPIDURAL

## 2024-03-04 MED ORDER — SODIUM CHLORIDE 0.9% FLUSH: 3.0000 mL | Freq: Two times a day (BID) | INTRAVENOUS | Status: DC

## 2024-03-04 MED ORDER — PHENYLEPHRINE 80 MCG/ML (10ML) SYRINGE FOR IV PUSH (FOR BLOOD PRESSURE SUPPORT): 80.0000 ug | PREFILLED_SYRINGE | INTRAVENOUS | Status: DC | PRN

## 2024-03-04 MED ORDER — MISOPROSTOL 25 MCG QUARTER TABLET
25.0000 ug | ORAL_TABLET | Freq: Once | ORAL | Status: AC
  Administered 2024-03-04: 25 ug via VAGINAL

## 2024-03-04 MED ORDER — FAMOTIDINE IN NACL 20-0.9 MG/50ML-% IV SOLN
20.0000 mg | Freq: Once | INTRAVENOUS | Status: AC
  Administered 2024-03-04: 20 mg via INTRAVENOUS
  Filled 2024-03-04: qty 50

## 2024-03-04 NOTE — Progress Notes (Signed)
 Labor Progress Note  Carol Schmitt is a 23 y.o. [id number] at [redacted]w[redacted]d by ultrasound admitted for induction of labor due to Pre-eclamptic toxemia of pregnancy..  Subjective: Feeling more intense contractions  Objective: BP (!) 148/90   Pulse 88   Temp 97.9 F (36.6 C) (Oral)   Resp 17   Ht 5\' 1"  (1.549 m)   Wt 76.2 kg   LMP 06/03/2023 (Exact Date)   BMI 31.74 kg/m   Fetal Assessment: FHT:  FHR: 140 bpm, variability: moderate,  accelerations:  Present,  decelerations:  Absent Category/reactivity:  Category I UC:   regular, every 1-3 min SVE:    Dilation: 3cm  Effacement: 60%  Station:  -2  Consistency: soft  Position: posterior  Membrane status: Intact Amniotic color: n/a  Labs: Lab Results  Component Value Date   WBC 8.8 03/04/2024   HGB 9.6 (L) 03/04/2024   HCT 29.1 (L) 03/04/2024   MCV 81.7 03/04/2024   PLT 162 03/04/2024    Assessment / Plan: Induction of labor due to preeclampsia 0315  2/50/-3 0607  Cytotec  PO and PV 1012  2/50/-3 1305  Cytotec  PO and PV 1718  2/60-70/-3 1720  Cytotec  PO and PV 2130  3/60/-2 - will start Pitocin    Labor: Progressing normally Preeclampsia:   PCR 470,  Plt 162,  AST/ALT 19/11 Vitals:   03/04/24 0108 03/04/24 0130 03/04/24 0144 03/04/24 0159  BP: (!) 131/95 128/84 130/88 (!) 136/90   03/04/24 0230 03/04/24 0315 03/04/24 0717 03/04/24 1310  BP: (!) 146/92 131/80 117/70 (!) 132/95   03/04/24 1510 03/04/24 1613 03/04/24 1723 03/04/24 1907  BP: 121/89 135/85 127/74 (!) 148/90     Fetal Wellbeing:  Category I Pain Control:  Labor support without medications I/D:  Afebrile, GBS neg, Intact Anticipated MOD:  NSVD  Aron Lard, CNM 03/04/2024, 9:34 PM

## 2024-03-04 NOTE — Progress Notes (Signed)
 Labor Progress Note  Carol Schmitt is a 23 y.o. [id number] at [redacted]w[redacted]d by ultrasound admitted for induction of labor due to Pre-eclamptic toxemia of pregnancy..  Subjective: Feeling more intense contractions, not yet ready for an epidural  Objective: BP 135/85   Pulse 75   Temp 98.3 F (36.8 C) (Oral)   Resp 17   Ht 5\' 1"  (1.549 m)   Wt 76.2 kg   LMP 06/03/2023 (Exact Date)   BMI 31.74 kg/m   Fetal Assessment: FHT:  FHR: 145 bpm, variability: moderate,  accelerations:  Present,  decelerations:  Absent Category/reactivity:  Category I UC:   regular, every 1-4 min SVE:    Dilation: 2cm  Effacement: 60 - 70 %  Station:  -3  Consistency: soft  Position: middle  Membrane status: Intact Amniotic color: n/a  Labs: Lab Results  Component Value Date   WBC 8.8 03/04/2024   HGB 9.6 (L) 03/04/2024   HCT 29.1 (L) 03/04/2024   MCV 81.7 03/04/2024   PLT 162 03/04/2024    Assessment / Plan: Induction of labor due to preeclampsia 0315  2/50/-3 0607  Cytotec  PO and PV 1012  2/50/-3 1305 Cytotec  PO and PV 1718  2/60-70/-3 1720 Cytotec  PO and PV   Labor: Progressing normally Preeclampsia:   PCR 470,  Plt 162,  AST/ALT 19/11 Vitals:   03/04/24 0043 03/04/24 0108 03/04/24 0130 03/04/24 0144  BP: (!) 155/102 (!) 131/95 128/84 130/88   03/04/24 0159 03/04/24 0230 03/04/24 0315 03/04/24 0717  BP: (!) 136/90 (!) 146/92 131/80 117/70   03/04/24 1310 03/04/24 1510 03/04/24 1613 03/04/24 1723  BP: (!) 132/95 121/89 135/85 127/74     Fetal Wellbeing:  Category I Pain Control:  Labor support without medications I/D:  Afebrile, GBS neg, Intact Anticipated MOD:  NSVD  Aron Lard, CNM 03/04/2024, 5:21 PM

## 2024-03-04 NOTE — H&P (Signed)
 OB History & Physical   History of Present Illness:   Chief Complaint: contractions and pelvic pressure   HPI:  Carol Schmitt is a 23 y.o. [phone] female at [redacted]w[redacted]d, Patient's last menstrual period was 06/03/2023 (exact date)., not consistent with US  at [redacted]w[redacted]d, with Estimated Date of Delivery: 03/15/24.  She presents to L&D for contractions, pelvic pressure, and decreased fetal movement. She reports contractions that have been present at night but she states they felt more regular yesterday. Also reports decreased fetal movement since contractions started. She is feeling movement but it feels less overall. Her pregnancy is complicated by history of preterm delivery, history of shoulder dystocia (30 sec), hx of PPH requiring blood transfusion, and anemia in pregnancy.  She denies Contractions, Loss of fluid, or Vaginal bleeding. Endorses fetal movement as present but overall decreased. Blood pressures were elevated on arrival to triage and remained mildly elevated. Denies HA, changes in vision, and RUQ pain. Preeclampsia labs obtained and urine PCR was significant for proteinuria at 0.47. Discussed admission for IOL d/t preeclampsia. Taejah and partner were amenable to plan.   Reports active fetal movement  Contractions: Irregular, mild, contractions  LOF/SROM: denies Vaginal bleeding: denies   Factors complicating pregnancy:  Principal Problem:   Preeclampsia, third trimester Active Problems:   History of postpartum hemorrhage, currently pregnant   History of shoulder dystocia in prior pregnancy, currently pregnant   Current pregnancy with history of preterm labor   Elevated blood pressure affecting pregnancy in third trimester, antepartum   Anemia affecting pregnancy in third trimester   High risk pregnancy due to history of preterm labor in third trimester    Prenatal care site:  [facility]  Patient Active Problem List   Diagnosis Date Noted   Elevated blood pressure  affecting pregnancy in third trimester, antepartum 03/04/2024   Preeclampsia, third trimester 03/04/2024   Anemia affecting pregnancy in third trimester 01/25/2024   High risk pregnancy due to history of preterm labor in third trimester 09/03/2023   Preterm labor third tri w preterm del third tri, fetus 1 09/21/2021   Current pregnancy with history of preterm labor 08/31/2021   History of postpartum hemorrhage, currently pregnant 05/02/2020   History of shoulder dystocia in prior pregnancy, currently pregnant 05/02/2020   Hx of preeclampsia, prior pregnancy, currently pregnant 05/01/2020    Prenatal Transfer Tool  Maternal Diabetes: No Genetic Screening: Normal Maternal Ultrasounds/Referrals: Normal Fetal Ultrasounds or other Referrals:  None Maternal Substance Abuse:  No Significant Maternal Medications:  None Significant Maternal Lab Results: Group B Strep negative  Maternal Medical History:   Past Medical History:  Diagnosis Date   Anemia    Chlamydia    Headache    Preterm labor    UTI (urinary tract infection)     Past Surgical History:  Procedure Laterality Date   THERAPEUTIC ABORTION      Allergies  Allergen Reactions   Apple Juice Other (See Comments)    Mouth discomfort    Prior to Admission medications   Medication Sig Start Date End Date Taking? Authorizing Provider  Prenatal Vit-Fe Fumarate-FA (MULTIVITAMIN-PRENATAL) 27-0.8 MG TABS tablet Take 1 tablet by mouth daily at 12 noon.   Yes [provider]  EPINEPHrine  (EPIPEN  2-PAK) 0.3 mg/0.3 mL IJ SOAJ injection Inject 0.3 mg into the muscle as needed for anaphylaxis. 05/20/21   Bradler, Evan K, MD  terconazole  (TERAZOL 7 ) 0.4 % vaginal cream Place 1 applicator vaginally at bedtime. 02/22/24   Teodora Fell,  CNM    OB History  Gravida Para Term Preterm AB Living  4 2 1 1 1 2   SAB IAB Ectopic Multiple Live Births  0 1 0 0 2    # Outcome Date GA Lbr Len/2nd Weight Sex Type Anes PTL Lv  4 Current            3 Preterm 09/19/21 [redacted]w[redacted]d  1814 g Caldwell Castles LIV  2 Term 05/02/20 [redacted]w[redacted]d / 00:17 2940 g M Vag-Spont EPI  LIV     Name: Conway Dennis     Apgar1: 8  Apgar5: 9  1 IAB              Social History: She  reports that she has never smoked. She has never used smokeless tobacco. She reports that she does not currently use alcohol. She reports that she does not currently use drugs after having used the following drugs: Marijuana.  Family History: family history includes Diabetes in her mother; Heart disease in her mother; Other in her father.   Review of Systems: A full review of systems was performed and negative except as noted in the HPI.     Physical Exam:  Vital Signs: BP 117/70 (BP Location: Right Arm)   Pulse 61   Temp 98.1 F (36.7 C) (Oral)   Resp 18   Ht 5\' 1"  (1.549 m)   Wt 76.2 kg   LMP 06/03/2023 (Exact Date)   BMI 31.74 kg/m   General: no acute distress.  HEENT: normocephalic, atraumatic Heart: regular rate & rhythm Lungs: normal respiratory effort Abdomen: soft, gravid, non-tender;  EFW: 7 lbs  Pelvic:   External: Normal external female genitalia  Cervix: Dilation: 2 / Effacement (%): 50 / Station: -3    Extremities: non-tender, symmetric, no edema bilaterally.  DTRs: 2+/2+  Neurologic: Alert & oriented x 3.    Results for orders placed or performed during the hospital encounter of 03/04/24 (from the past 24 hours)  Protein / creatinine ratio, urine     Status: Abnormal   Collection Time: 03/04/24  1:30 AM  Result Value Ref Range   Creatinine, Urine 91 mg/dL   Total Protein, Urine 43 mg/dL   Protein Creatinine Ratio 0.47 (H) 0.00 - 0.15 mg/mg[Cre]  Comprehensive metabolic panel     Status: Abnormal   Collection Time: 03/04/24  1:52 AM  Result Value Ref Range   Sodium 133 (L) 135 - 145 mmol/L   Potassium 3.6 3.5 - 5.1 mmol/L   Chloride 107 98 - 111 mmol/L   CO2 18 (L) 22 - 32 mmol/L   Glucose, Bld 95 70 - 99 mg/dL   BUN 10 6 - 20 mg/dL    Creatinine, Ser 1.30 0.44 - 1.00 mg/dL   Calcium  8.7 (L) 8.9 - 10.3 mg/dL   Total Protein 6.5 6.5 - 8.1 g/dL   Albumin 2.8 (L) 3.5 - 5.0 g/dL   AST 19 15 - 41 U/L   ALT 11 0 - 44 U/L   Alkaline Phosphatase 113 38 - 126 U/L   Total Bilirubin 0.4 0.0 - 1.2 mg/dL   GFR, Estimated >86 >57 mL/min   Anion gap 8 5 - 15  CBC     Status: Abnormal   Collection Time: 03/04/24  1:52 AM  Result Value Ref Range   WBC 8.8 4.0 - 10.5 K/uL   RBC 3.56 (L) 3.87 - 5.11 MIL/uL   Hemoglobin 9.6 (L) 12.0 - 15.0 g/dL   HCT 84.6 (  L) 36.0 - 46.0 %   MCV 81.7 80.0 - 100.0 fL   MCH 27.0 26.0 - 34.0 pg   MCHC 33.0 30.0 - 36.0 g/dL   RDW 16.1 09.6 - 04.5 %   Platelets 162 150 - 400 K/uL   nRBC 0.2 0.0 - 0.2 %  Type and screen [facility]     Status: None   Collection Time: 03/04/24  5:21 AM  Result Value Ref Range   ABO/RH(D) B POS    Antibody Screen NEG    Sample Expiration      03/07/2024,2359 Performed at [facility], [address] [place], [place] [address]     Pertinent Results:  Prenatal Labs: Blood type/Rh B POS   Antibody screen Negative    Rubella ImmuneImmune (11/22 0000)   Varicella Immune  RPR NRNonreactive (03/10 0000)   HBsAg NegNegative (11/22 0000)  Hep C NR   HIV NegNon-reactive (03/10 0000)   GC neg  Chlamydia neg  Genetic screening cfDNA negative   1 hour GTT 117  3 hour GTT N/A  GBS Negative     FHT:  FHR: 135 bpm, variability: moderate,  accelerations:  Present,  decelerations:  Absent Category/reactivity:  Category I UC:   irregular, every 3-10 minutes   Cephalic by Leopolds and SVE   US  OB Transvaginal Result Date: 02/11/2024 CLINICAL DATA:  History of preterm labor. Assigned gestational age is 36 weeks 1 day. EXAM: LIMITED OBSTETRIC ULTRASOUND COMPARISON:  01/15/2024 FINDINGS: Number of Fetuses: 1 Heart Rate:  153 bpm Movement: Fetal movement is observed. Presentation: Cephalic presentation. Placental Location: Posterior Previa:  None. Amniotic Fluid (Subjective):  Within normal limits. AFI: 11.4 cm BPD: 8.6 cm 34 w  6 d MATERNAL FINDINGS: Cervix: Cervical length measures 4.1 cm. Cervix appears closed but there is a small amount of fluid in the endocervical canal. Uterus/Adnexae: No abnormality visualized. IMPRESSION: Single intrauterine pregnancy in cephalic presentation. No acute complication is demonstrated on limited ultrasound evaluation. Cervix appears closed with cervical length measuring 4.1 cm. This exam is performed on an emergent basis and does not comprehensively evaluate fetal size, dating, or anatomy; follow-up complete OB US  should be considered if further fetal assessment is warranted. Electronically Signed   By: Boyce Byes M.D.   On: 02/11/2024 16:32   US  OB Limited Result Date: 02/11/2024 CLINICAL DATA:  History of preterm labor. Assigned gestational age is 36 weeks 1 day. EXAM: LIMITED OBSTETRIC ULTRASOUND COMPARISON:  01/15/2024 FINDINGS: Number of Fetuses: 1 Heart Rate:  153 bpm Movement: Fetal movement is observed. Presentation: Cephalic presentation. Placental Location: Posterior Previa: None. Amniotic Fluid (Subjective):  Within normal limits. AFI: 11.4 cm BPD: 8.6 cm 34 w  6 d MATERNAL FINDINGS: Cervix: Cervical length measures 4.1 cm. Cervix appears closed but there is a small amount of fluid in the endocervical canal. Uterus/Adnexae: No abnormality visualized. IMPRESSION: Single intrauterine pregnancy in cephalic presentation. No acute complication is demonstrated on limited ultrasound evaluation. Cervix appears closed with cervical length measuring 4.1 cm. This exam is performed on an emergent basis and does not comprehensively evaluate fetal size, dating, or anatomy; follow-up complete OB US  should be considered if further fetal assessment is warranted. Electronically Signed   By: Boyce Byes M.D.   On: 02/11/2024 16:32    Assessment:  Corean SIDNI FUSCO is a 23 y.o. [id number] female at [redacted]w[redacted]d with  preeclampsia without severe features.   Plan:  1. Admit to Labor & Delivery - Admission status: Inpatient -  Dr. Nickey Barn MD notified of admission and plan of care  - Reason for admission: induction of labor - consents reviewed and obtained  2. Fetal Well being  - Fetal Tracing: cat 1 - Group B Streptococcus ppx not indicated: GBS negative - Presentation: cephalic confirmed by SVE   3. Routine OB: - Prenatal labs reviewed, as above - Rh positive - CBC, T&S, RPR on admit - Clear liquid diet , saline lock  4. Induction of labor  - Contractions monitored with external toco - Pelvis proven to 2940 grams, adequate for trial of labor  - Plan for induction with misoprostol   - Induction with oxytocin  and AROM as appropriate  - Plan for  continuous fetal monitoring - Maternal pain control as desired; planning regional anesthesia - Anticipate vaginal delivery  5. Preeclampsia - BP's normal to mild range - Asymptomatic  - Urine PCR 0.47, CMP WNL, Platelets 162 - Risks of preeclampsia discussed with pt and partner along with recommendations to proceed with induction of labor. Questions answered. Both Lavita and her partner are amenable with induction of labor.   6. Post Partum Planning: - Infant feeding: breast feeding - Contraception: no method - Flu vaccine: declined  - Tdap vaccine: declined  - RSV vaccine: Not in season   Teodora Fell, [place] 03/04/24 7:47 AM  Fraser Jackson, CNM Certified Nurse Midwife [facility]

## 2024-03-04 NOTE — OB Triage Note (Signed)
 Pt states decreased fetal movement tonight. Pt also states increased pelvic pressure.

## 2024-03-04 NOTE — Progress Notes (Signed)
 Labor Progress Note  Carol Schmitt is a 23 y.o. [id number] at [redacted]w[redacted]d by ultrasound admitted for induction of labor due to Pre-eclamptic toxemia of pregnancy..  Subjective: Feeling more UCs since cytotec   Objective: BP 117/70 (BP Location: Right Arm)   Pulse 61   Temp 98.1 F (36.7 C) (Oral)   Resp 18   Ht 5\' 1"  (1.549 m)   Wt 76.2 kg   LMP 06/03/2023 (Exact Date)   BMI 31.74 kg/m   Fetal Assessment: FHT:  FHR: 135 bpm, variability: moderate,  accelerations:  Present,  decelerations:  Absent Category/reactivity:  Category I UC:   irregular, occasional  SVE:    Dilation: 2cm  Effacement: 50%  Station:  -3  Consistency: medium  Position: posterior  Membrane status: Intact Amniotic color: n/a  Labs: Lab Results  Component Value Date   WBC 8.8 03/04/2024   HGB 9.6 (L) 03/04/2024   HCT 29.1 (L) 03/04/2024   MCV 81.7 03/04/2024   PLT 162 03/04/2024    Assessment / Plan: Induction of labor due to preeclampsia 0315  2/50/-3 0607  Cytotec  PO and PV  Labor: Progressing normally Preeclampsia:   PCR 470,  Plt 162,  AST/ALT 19/11 Vitals:   03/04/24 0043 03/04/24 0108 03/04/24 0130 03/04/24 0144  BP: (!) 155/102 (!) 131/95 128/84 130/88   03/04/24 0159 03/04/24 0230 03/04/24 0315 03/04/24 0717  BP: (!) 136/90 (!) 146/92 131/80 117/70     Fetal Wellbeing:  Category I Pain Control:  Labor support without medications I/D:  Afebrile, GBS neg, Intact Anticipated MOD:  NSVD  Carol Schmitt, CNM 03/04/2024, 9:08 AM

## 2024-03-04 NOTE — Progress Notes (Addendum)
 Labor Progress Note  Carol Schmitt is a 23 y.o. [id number] at [redacted]w[redacted]d by ultrasound admitted for induction of labor due to Pre-eclamptic toxemia of pregnancy..  Subjective:   Objective: BP 117/70 (BP Location: Right Arm)   Pulse 61   Temp 98.1 F (36.7 C) (Oral)   Resp 18   Ht 5\' 1"  (1.549 m)   Wt 76.2 kg   LMP 06/03/2023 (Exact Date)   BMI 31.74 kg/m   Fetal Assessment: FHT:  FHR: 135 bpm, variability: moderate,  accelerations:  Present,  decelerations:  Absent Category/reactivity:  Category I UC:   irregular, occasional  SVE:    Dilation: 2cm  Effacement: 50%  Station:  -3  Consistency: medium  Position: posterior  Membrane status: Intact Amniotic color: n/a  Labs: Lab Results  Component Value Date   WBC 8.8 03/04/2024   HGB 9.6 (L) 03/04/2024   HCT 29.1 (L) 03/04/2024   MCV 81.7 03/04/2024   PLT 162 03/04/2024    Assessment / Plan: Induction of labor due to preeclampsia 0315  2/50/-3 0607  Cytotec  PO and PV 1012  2/50/-3   Labor: Progressing normally Preeclampsia:   PCR 470,  Plt 162,  AST/ALT 19/11 Vitals:   03/04/24 0043 03/04/24 0108 03/04/24 0130 03/04/24 0144  BP: (!) 155/102 (!) 131/95 128/84 130/88   03/04/24 0159 03/04/24 0230 03/04/24 0315 03/04/24 0717  BP: (!) 136/90 (!) 146/92 131/80 117/70     Fetal Wellbeing:  Category I Pain Control:  Labor support without medications I/D:  Afebrile, GBS neg, Intact Anticipated MOD:  NSVD  Aron Lard, CNM 03/04/2024, 12:39 PM

## 2024-03-05 ENCOUNTER — Encounter: Payer: Self-pay | Admitting: Obstetrics and Gynecology

## 2024-03-05 LAB — CBC
HCT: 29.1 % — ABNORMAL LOW (ref 36.0–46.0)
HCT: 28.9 % — ABNORMAL LOW (ref 36.0–46.0)
Hemoglobin: 9.5 g/dL — ABNORMAL LOW (ref 12.0–15.0)
Hemoglobin: 9.4 g/dL — ABNORMAL LOW (ref 12.0–15.0)
MCH: 26.5 pg (ref 26.0–34.0)
MCH: 26.9 pg (ref 26.0–34.0)
MCHC: 32.6 g/dL (ref 30.0–36.0)
MCHC: 32.5 g/dL (ref 30.0–36.0)
MCV: 81.3 fL (ref 80.0–100.0)
MCV: 82.8 fL (ref 80.0–100.0)
Platelets: 153 10*3/uL (ref 150–400)
Platelets: 166 10*3/uL (ref 150–400)
RBC: 3.58 MIL/uL — ABNORMAL LOW (ref 3.87–5.11)
RBC: 3.49 MIL/uL — ABNORMAL LOW (ref 3.87–5.11)
RDW: 14.6 % (ref 11.5–15.5)
RDW: 14.9 % (ref 11.5–15.5)
WBC: 9.5 10*3/uL (ref 4.0–10.5)
WBC: 14.9 10*3/uL — ABNORMAL HIGH (ref 4.0–10.5)
nRBC: 0 % (ref 0.0–0.2)
nRBC: 0 % (ref 0.0–0.2)

## 2024-03-05 MED ORDER — TETANUS-DIPHTH-ACELL PERTUSSIS 5-2.5-18.5 LF-MCG/0.5 IM SUSY: 0.5000 mL | PREFILLED_SYRINGE | Freq: Once | INTRAMUSCULAR | Status: DC

## 2024-03-05 MED ORDER — CEFAZOLIN SODIUM-DEXTROSE 1-4 GM/50ML-% IV SOLN
1.0000 g | Freq: Once | INTRAVENOUS | Status: AC
  Administered 2024-03-05: 1 g via INTRAVENOUS
  Filled 2024-03-05: qty 50

## 2024-03-05 MED ORDER — ONDANSETRON HCL 4 MG PO TABS: 4.0000 mg | ORAL_TABLET | ORAL | Status: DC | PRN

## 2024-03-05 MED ORDER — PRENATAL MULTIVITAMIN CH
1.0000 | ORAL_TABLET | Freq: Every day | ORAL | Status: DC
  Administered 2024-03-06 – 2024-03-07 (×2): 1 via ORAL
  Filled 2024-03-05 (×2): qty 1

## 2024-03-05 MED ORDER — BUPIVACAINE HCL (PF) 0.25 % IJ SOLN
INTRAMUSCULAR | Status: DC | PRN
  Administered 2024-03-05 (×2): 4 mL via EPIDURAL

## 2024-03-05 MED ORDER — ZOLPIDEM TARTRATE 5 MG PO TABS: 5.0000 mg | ORAL_TABLET | Freq: Every evening | ORAL | Status: DC | PRN

## 2024-03-05 MED ORDER — COCONUT OIL OIL: 1.0000 | TOPICAL_OIL | Status: DC | PRN

## 2024-03-05 MED ORDER — SIMETHICONE 80 MG PO CHEW: 80.0000 mg | CHEWABLE_TABLET | ORAL | Status: DC | PRN

## 2024-03-05 MED ORDER — ACETAMINOPHEN 325 MG PO TABS
650.0000 mg | ORAL_TABLET | ORAL | Status: DC | PRN
  Administered 2024-03-05: 650 mg via ORAL
  Filled 2024-03-05: qty 2

## 2024-03-05 MED ORDER — IBUPROFEN 600 MG PO TABS
600.0000 mg | ORAL_TABLET | Freq: Four times a day (QID) | ORAL | Status: DC
  Administered 2024-03-05 – 2024-03-07 (×6): 600 mg via ORAL
  Filled 2024-03-05 (×7): qty 1

## 2024-03-05 MED ORDER — TRANEXAMIC ACID-NACL 1000-0.7 MG/100ML-% IV SOLN
1000.0000 mg | INTRAVENOUS | Status: AC
  Administered 2024-03-05: 1000 mg via INTRAVENOUS

## 2024-03-05 MED ORDER — ONDANSETRON HCL 4 MG/2ML IJ SOLN: 4.0000 mg | INTRAMUSCULAR | Status: DC | PRN

## 2024-03-05 MED ORDER — TRANEXAMIC ACID-NACL 1000-0.7 MG/100ML-% IV SOLN
INTRAVENOUS | Status: AC
  Filled 2024-03-05: qty 100

## 2024-03-05 MED ORDER — DIPHENHYDRAMINE HCL 25 MG PO CAPS: 25.0000 mg | ORAL_CAPSULE | Freq: Four times a day (QID) | ORAL | Status: DC | PRN

## 2024-03-05 MED ORDER — LIDOCAINE HCL (PF) 1 % IJ SOLN
INTRAMUSCULAR | Status: DC | PRN
  Administered 2024-03-04: 3 mL via SUBCUTANEOUS

## 2024-03-05 MED ORDER — OXYCODONE HCL 5 MG PO TABS: 5.0000 mg | ORAL_TABLET | ORAL | Status: DC | PRN

## 2024-03-05 MED ORDER — BENZOCAINE-MENTHOL 20-0.5 % EX AERO: 1.0000 | INHALATION_SPRAY | CUTANEOUS | Status: DC | PRN

## 2024-03-05 MED ORDER — LIDOCAINE-EPINEPHRINE (PF) 1.5 %-1:200000 IJ SOLN
INTRAMUSCULAR | Status: DC | PRN
  Administered 2024-03-04: 3 mL via EPIDURAL

## 2024-03-05 MED ORDER — MISOPROSTOL 200 MCG PO TABS
800.0000 ug | ORAL_TABLET | Freq: Once | ORAL | Status: AC
  Administered 2024-03-05: 800 ug via VAGINAL

## 2024-03-05 MED ORDER — DIBUCAINE (PERIANAL) 1 % EX OINT: 1.0000 | TOPICAL_OINTMENT | CUTANEOUS | Status: DC | PRN

## 2024-03-05 MED ORDER — WITCH HAZEL-GLYCERIN EX PADS: 1.0000 | MEDICATED_PAD | CUTANEOUS | Status: DC | PRN

## 2024-03-05 MED ORDER — SENNOSIDES-DOCUSATE SODIUM 8.6-50 MG PO TABS
2.0000 | ORAL_TABLET | Freq: Every day | ORAL | Status: DC
  Administered 2024-03-06 – 2024-03-07 (×2): 2 via ORAL
  Filled 2024-03-05 (×2): qty 2

## 2024-03-05 NOTE — Progress Notes (Signed)
 Labor Progress Note  Carol Schmitt is a 23 y.o. [id number] at [redacted]w[redacted]d by ultrasound admitted for induction of labor due to Pre-eclamptic toxemia of pregnancy..  Subjective: Feeling more comfortable with her epidural  Objective: BP 122/77   Pulse 71   Temp 98.8 F (37.1 C) (Oral)   Resp 17   Ht 5\' 1"  (1.549 m)   Wt 76.2 kg   LMP 06/03/2023 (Exact Date)   SpO2 99%   BMI 31.74 kg/m   Fetal Assessment: FHT:  FHR: 135 bpm, variability: moderate,  accelerations:  Present,  decelerations:  Absent Category/reactivity:  Category I UC:   regular, every 1-4 min SVE:    Dilation: 3.5cm  Effacement: 80%  Station:  -1  Consistency: soft  Position: middle  Membrane status: Intact Amniotic color: n/a  Labs: Lab Results  Component Value Date   WBC 9.5 03/05/2024   HGB 9.5 (L) 03/05/2024   HCT 29.1 (L) 03/05/2024   MCV 81.3 03/05/2024   PLT 153 03/05/2024    Assessment / Plan: Induction of labor due to preeclampsia 0315  2/50/-3 0607  Cytotec  PO and PV 1012  2/50/-3 1305  Cytotec  PO and PV 1718  2/60-70/-3 1720  Cytotec  PO and PV 2130  3/60/-2  2312  Pitocin  started  2355  Epidural placed 0105  3.5/80/-1 Pitocin  currently at 8mU   Labor: Progressing normally Preeclampsia:   PCR 470,  Plt 162,  AST/ALT 19/11 Vitals:   03/04/24 1510 03/04/24 1613 03/04/24 1723 03/04/24 1907  BP: 121/89 135/85 127/74 (!) 148/90   03/04/24 2312 03/05/24 0010 03/05/24 0015 03/05/24 0030  BP: (!) 135/92 (!) 135/96 (!) 145/91 (!) 136/93   03/05/24 0035 03/05/24 0050 03/05/24 0105 03/05/24 0135  BP: (!) 138/92 (!) 133/92 125/88 122/77     Fetal Wellbeing:  Category I Pain Control:  Epidural I/D:  Afebrile, GBS neg, Intact Anticipated MOD:  NSVD  Jenifer E Kelvis Berger, CNM 03/05/2024, 2:17 AM

## 2024-03-05 NOTE — Progress Notes (Signed)
 Labor Progress Note  Carol Schmitt is a 23 y.o. [id number] at [redacted]w[redacted]d by ultrasound admitted for induction of labor due to pre-eclampsia.  Subjective: she reports feeling pain and pressure in her vagina and bottom  Objective: BP 113/70   Pulse 65   Temp 98.1 F (36.7 C) (Oral)   Resp 15   Ht 5\' 1"  (1.549 m)   Wt 76.2 kg   LMP 06/03/2023 (Exact Date)   SpO2 99%   BMI 31.74 kg/m  Notable VS details: reviewed  Fetal Assessment: FHT:  FHR: 130 bpm, variability: moderate,  accelerations:  Present,  decelerations:  Present intermittent early deceleration Category/reactivity:  Category I UC:   regular, every 3-5 minutes SVE:    Dilation: 5cm  Effacement: 60%  Station:  -1  Consistency: medium  Position: middle  Membrane status:AROM @ 0841 Amniotic color: clear  Labs: Lab Results  Component Value Date   WBC 9.5 03/05/2024   HGB 9.5 (L) 03/05/2024   HCT 29.1 (L) 03/05/2024   MCV 81.3 03/05/2024   PLT 153 03/05/2024    Assessment / Plan: 23 year old [id number] at [redacted]w[redacted]d here with IOL for pre-eclampsia 0315  2/50/-3 0607  Cytotec  PO and PV 1012  2/50/-3 1305  Cytotec  PO and PV 1718  2/60-70/-3 1720  Cytotec  PO and PV 2130  3/60/-2  2312  Pitocin  started  2355  Epidural placed 0105  3.5/80/-1 0620  4.5/80/-1 0841  5/60/-1, AROM with clear fluid Pitocin  at 30mu/min  Labor: slow labor progress. AROM with clear fluid. Pitocin  at 38mu/min, will plan to recheck in 3-4hrs unless indicated sooner. Dr. Cleora Daft updated on plan of care. Preeclampsia:  labs stable Fetal Wellbeing:  Category I Pain Control:  Epidural I/D:  GBS negative, AROM @ 0841 Anticipated MOD:  NSVD  [name], CNM 03/05/2024, 8:50 AM

## 2024-03-05 NOTE — Progress Notes (Signed)
 S: she reports she is feeling better, she feels tired but denies dizziness.   O: QBL CBC    Component Value Date/Time   WBC 14.9 (H) 03/05/2024 1545   RBC 3.49 (L) 03/05/2024 1545   HGB 9.4 (L) 03/05/2024 1545   HCT 28.9 (L) 03/05/2024 1545   PLT 166 03/05/2024 1545   MCV 82.8 03/05/2024 1545   MCH 26.9 03/05/2024 1545   MCHC 32.5 03/05/2024 1545   RDW 14.9 03/05/2024 1545   LYMPHSABS 1.6 06/03/2023 1621   MONOABS 0.3 06/03/2023 1621   EOSABS 0.1 06/03/2023 1621   BASOSABS 0.0 06/03/2023 1621   Vitals:   03/05/24 1528 03/05/24 1558  BP: 135/88 139/88  Pulse: 75 79  Resp:    Temp:    SpO2:     A: s/p postpartum hemorrhage  P: Jada suction turned off, no continued bleeding noted. Jada removed without difficulty. CBC stable (Hgb 9.5 -> 9.4). Repeat CBC in the morning.  [name], CNM 03/05/2024 4:17 PM

## 2024-03-05 NOTE — Discharge Summary (Signed)
 Postpartum Discharge Summary  Patient Name: Carol Schmitt DOB: Feb 27, 2001 MRN: [id number]  Date of admission: 03/04/2024 Delivery date:03/05/2024 Delivering provider: Lavanda Porter RENEE Date of discharge: 03/07/2024  Primary OB: [facility] [id number] last menstrual period was 06/03/2023 (exact date). EDC Estimated Date of Delivery: 03/15/24 Gestational Age at Delivery: [redacted]w[redacted]d   Admitting diagnosis: Elevated blood pressure affecting pregnancy in third trimester, antepartum [O16.3] Preeclampsia, third trimester [O14.93] Intrauterine pregnancy: [redacted]w[redacted]d     Secondary diagnosis:   Principal Problem:   NSVD (normal spontaneous vaginal delivery) Active Problems:   History of postpartum hemorrhage, currently pregnant   History of shoulder dystocia in prior pregnancy, currently pregnant   Current pregnancy with history of preterm labor   Elevated blood pressure affecting pregnancy in third trimester, antepartum   Anemia affecting pregnancy in third trimester   High risk pregnancy due to history of preterm labor in third trimester   Preeclampsia, third trimester   Postpartum hemorrhage  Discharge Diagnosis: Term Pregnancy Delivered, Preeclampsia (mild), and PPH                                                Post partum procedures:Venofer Induction:: AROM, Pitocin , and Cytotec  Complications: None Delivery Type: spontaneous vaginal delivery Anesthesia: epidural anesthesia Placenta: spontaneous To Pathology: No  Laceration: right periurethral abrasion, not repaired Episiotomy: none  Prenatal Labs:  Blood type/Rh B POS   Antibody screen Negative    Rubella ImmuneImmune (11/22 0000)   Varicella Immune  RPR NRNonreactive (03/10 0000)   HBsAg NegNegative (11/22 0000)  Hep C NR   HIV NegNon-reactive (03/10 0000)   GC neg  Chlamydia neg  Genetic screening cfDNA negative   1 hour GTT 117  3 hour GTT N/A  GBS Negative      Hospital course: Induction of Labor With  Vaginal Delivery   23 y.o. yo [id number] at [redacted]w[redacted]d was admitted to the hospital 03/04/2024 for induction of labor.  Indication for induction: Preeclampsia.  Patient had an labor course complicated by prolonged early labor. Once she AROM, she progressed quickly to 10/100/+2 and pushed , delivering viable female infant over intact perineum. Apgars 7/9. Postpartum hemorrhage of after delivery of the placenta managed with TXA, pitocin  bolus, cytotec , manual sweep, I&O cath, and Jada placement.  Membrane Rupture Time/Date: 8:41 AM,03/05/2024  Delivery Method:Vaginal, Spontaneous Operative Delivery:N/A Episiotomy: None Lacerations:  Periurethral Details of delivery can be found in separate delivery note.  Patient had a postpartum course complicated by severe anemia. Patient is discharged home 03/07/24.  Newborn Data: Birth date:03/05/2024 Birth time:12:33 PM Gender:Female Living status:Living Apgars:7 ,9  Weight:3220 g  Magnesium  Sulfate received: No BMZ received: No Rhophylac:No MMR:No Varivax vaccine given: was not indicated T-DaP:declined Flu: No  Transfusion:No  Physical exam  Vitals:   03/06/24 2000 03/06/24 2300 03/07/24 0347 03/07/24 0841  BP: (!) 124/95 114/79 129/79 118/87  Pulse: 73 80 82 92  Resp: 18 18 18 18   Temp: 98.2 F (36.8 C) 98 F (36.7 C) 97.8 F (36.6 C) 98.4 F (36.9 C)  TempSrc: Oral Oral Oral Oral  SpO2: 100% 99% 100% 98%  Weight:      Height:       General: alert, cooperative, and no distress Lochia: appropriate Uterine Fundus: firm Perineum:minimal edema/laceration hemostatic Incision: n/a DVT Evaluation: No evidence of DVT seen on physical exam.  Labs: Lab  Results  Component Value Date   WBC 12.2 (H) 03/06/2024   HGB 7.6 (L) 03/06/2024   HCT 23.0 (L) 03/06/2024   MCV 81.6 03/06/2024   PLT 134 (L) 03/06/2024      Latest Ref Rng & Units 03/06/2024    2:10 PM  CMP  Glucose 70 - 99 mg/dL 161   BUN 6 - 20 mg/dL 7   Creatinine 0.96 - 0.45  mg/dL 4.09   Sodium 811 - 914 mmol/L 136   Potassium 3.5 - 5.1 mmol/L 3.4   Chloride 98 - 111 mmol/L 106   CO2 22 - 32 mmol/L 25   Calcium  8.9 - 10.3 mg/dL 8.6   Total Protein 6.5 - 8.1 g/dL 5.4   Total Bilirubin 0.0 - 1.2 mg/dL 0.7   Alkaline Phos 38 - 126 U/L 94   AST 15 - 41 U/L 31   ALT 0 - 44 U/L 12    Edinburgh Score:    09/20/2021    9:00 AM  Edinburgh Postnatal Depression Scale Screening Tool  I have been able to laugh and see the funny side of things. 0  I have looked forward with enjoyment to things. 0  I have blamed myself unnecessarily when things went wrong. 0  I have been anxious or worried for no good reason. 0  I have felt scared or panicky for no good reason. 0  Things have been getting on top of me. 1  I have been so unhappy that I have had difficulty sleeping. 0  I have felt sad or miserable. 0  I have been so unhappy that I have been crying. 0  The thought of harming myself has occurred to me. 0  Edinburgh Postnatal Depression Scale Total 1    Risk assessment for postpartum VTE and prophylactic treatment: Very high risk factors: None High risk factors: None Moderate risk factors: Preeclampsia  and BMI 30-40 kg/m2  Postpartum VTE prophylaxis with LMWH not indicated  After visit meds:  Allergies as of 03/07/2024       Reactions   Apple Juice Other (See Comments)   Mouth discomfort        Medication List     STOP taking these medications    terconazole  0.4 % vaginal cream Commonly known as: TERAZOL 7        TAKE these medications    EPINEPHrine  0.3 mg/0.3 mL Soaj injection Commonly known as: EpiPen  2-Pak Inject 0.3 mg into the muscle as needed for anaphylaxis.   multivitamin-prenatal 27-0.8 MG Tabs tablet Take 1 tablet by mouth daily at 12 noon.   NIFEdipine  60 MG 24 hr tablet Commonly known as: ADALAT  CC Take 1 tablet (60 mg total) by mouth daily.       Discharge home in stable condition Infant Feeding: Bottle and  Breast Infant Disposition:home with mother Discharge instruction: per After Visit Summary and Postpartum booklet. Activity: Advance as tolerated. Pelvic rest for 6 weeks.  Diet: routine diet Anticipated Birth Control: Unsure Postpartum Appointment:6 weeks Additional Postpartum F/U: BP check 2-3 days Future Appointments:No future appointments. Follow up Visit:  Follow-up Information     [name], CNM. Schedule an appointment as soon as possible for a visit in 6 week(s).   Specialty: Certified Nurse Midwife Why: 6wk postpartum Contact information: [address] [place] [address] [phone]         [name], CNM. Schedule an appointment as soon as possible for a visit in 3 day(s).  Specialty: Certified Nurse Midwife Why: blood pressure check Contact information: [address] [place] [address] [phone]                 Plan:  BREYA CASS was discharged to home in good condition. Follow-up appointment as directed.    Signed:  Aron Lard, CNM 03/07/2024 8:51 AM

## 2024-03-05 NOTE — Anesthesia Preprocedure Evaluation (Signed)
 Anesthesia Evaluation  Patient identified by MRN, date of birth, ID band Patient awake    Reviewed: Allergy & Precautions, NPO status , Patient's Chart, lab work & pertinent test results  Airway Mallampati: III  TM Distance: >3 FB Neck ROM: full    Dental  (+) Chipped   Pulmonary neg pulmonary ROS   Pulmonary exam normal        Cardiovascular Exercise Tolerance: Good hypertension, Normal cardiovascular exam     Neuro/Psych  Headaches    GI/Hepatic negative GI ROS,,,  Endo/Other    Renal/GU   negative genitourinary   Musculoskeletal   Abdominal   Peds  Hematology negative hematology ROS (+)   Anesthesia Other Findings Patient reports history of scoliosis   Past Medical History: No date: Anemia No date: Chlamydia No date: Headache No date: Preterm labor No date: UTI (urinary tract infection)  Past Surgical History: No date: THERAPEUTIC ABORTION  BMI    Body Mass Index: 31.74 kg/m      Reproductive/Obstetrics (+) Pregnancy                             Anesthesia Physical Anesthesia Plan  ASA: 3  Anesthesia Plan: Epidural   Post-op Pain Management:    Induction:   PONV Risk Score and Plan:   Airway Management Planned: Natural Airway  Additional Equipment:   Intra-op Plan:   Post-operative Plan:   Informed Consent: I have reviewed the patients History and Physical, chart, labs and discussed the procedure including the risks, benefits and alternatives for the proposed anesthesia with the patient or authorized representative who has indicated his/her understanding and acceptance.     Dental Advisory Given  Plan Discussed with: Anesthesiologist  Anesthesia Plan Comments: (Patient reports no bleeding problems and no anticoagulant use.   Patient consented for risks of anesthesia including but not limited to:  - adverse reactions to medications - risk of bleeding,  infection and or nerve damage from epidural that could lead to paralysis - risk of headache or failed epidural - nerve damage due to positioning - that if epidural is used for C-section that there is a chance of epidural failure requiring spinal placement or conversion to GA - Damage to heart, brain, lungs, other parts of body or loss of life  Patient voiced understanding and assent.)       Anesthesia Quick Evaluation

## 2024-03-05 NOTE — Anesthesia Procedure Notes (Signed)
 Epidural Patient location during procedure: OB Start time: 03/04/2024 11:46 PM End time: 03/04/2024 11:55 PM  Staffing Anesthesiologist: Elgar Scoggins, Portia Brittle, MD Performed: anesthesiologist   Preanesthetic Checklist Completed: patient identified, IV checked, site marked, risks and benefits discussed, surgical consent, monitors and equipment checked, pre-op evaluation and timeout performed  Epidural Patient position: sitting Prep: ChloraPrep Patient monitoring: heart rate, continuous pulse ox and blood pressure Approach: midline Location: L3-L4 Injection technique: LOR saline  Needle:  Needle type: Tuohy  Needle gauge: 17 G Needle length: 9 cm and 9 Needle insertion depth: 7 cm Catheter type: closed end flexible Catheter size: 19 Gauge Catheter at skin depth: 12 cm Test dose: negative and 1.5% lidocaine  with Epi 1:200 K  Assessment Sensory level: T10 Events: blood not aspirated, no cerebrospinal fluid, injection not painful, no injection resistance, no paresthesia and negative IV test  Additional Notes 1 attempt Pt. Evaluated and documentation done after procedure finished. Patient identified. Risks/Benefits/Options discussed with patient including but not limited to bleeding, infection, nerve damage, paralysis, failed block, incomplete pain control, headache, blood pressure changes, nausea, vomiting, reactions to medication both or allergic, itching and postpartum back pain. Confirmed with bedside nurse the patient's most recent platelet count. Confirmed with patient that they are not currently taking any anticoagulation, have any bleeding history or any family history of bleeding disorders. Patient expressed understanding and wished to proceed. All questions were answered. Sterile technique was used throughout the entire procedure. Please see nursing notes for vital signs. Test dose was given through epidural catheter and negative prior to continuing to dose epidural or start  infusion. Warning signs of high block given to the patient including shortness of breath, tingling/numbness in hands, complete motor block, or any concerning symptoms with instructions to call for help. Patient was given instructions on fall risk and not to get out of bed. All questions and concerns addressed with instructions to call with any issues or inadequate analgesia.    Patient tolerated the insertion well without immediate complications.Reason for block:procedure for pain

## 2024-03-06 LAB — COMPREHENSIVE METABOLIC PANEL WITH GFR
ALT: 12 U/L (ref 0–44)
AST: 31 U/L (ref 15–41)
Albumin: 2.3 g/dL — ABNORMAL LOW (ref 3.5–5.0)
Alkaline Phosphatase: 94 U/L (ref 38–126)
Anion gap: 5 (ref 5–15)
BUN: 7 mg/dL (ref 6–20)
CO2: 25 mmol/L (ref 22–32)
Calcium: 8.6 mg/dL — ABNORMAL LOW (ref 8.9–10.3)
Chloride: 106 mmol/L (ref 98–111)
Creatinine, Ser: 0.76 mg/dL (ref 0.44–1.00)
GFR, Estimated: >60 mL/min (ref >60)
Glucose, Bld: 111 mg/dL — ABNORMAL HIGH (ref 70–99)
Potassium: 3.4 mmol/L — ABNORMAL LOW (ref 3.5–5.1)
Sodium: 136 mmol/L (ref 135–145)
Total Bilirubin: 0.7 mg/dL (ref 0.0–1.2)
Total Protein: 5.4 g/dL — ABNORMAL LOW (ref 6.5–8.1)

## 2024-03-06 LAB — CBC
HCT: 23 % — ABNORMAL LOW (ref 36.0–46.0)
Hemoglobin: 7.6 g/dL — ABNORMAL LOW (ref 12.0–15.0)
MCH: 27 pg (ref 26.0–34.0)
MCHC: 33 g/dL (ref 30.0–36.0)
MCV: 81.6 fL (ref 80.0–100.0)
Platelets: 134 10*3/uL — ABNORMAL LOW (ref 150–400)
RBC: 2.82 MIL/uL — ABNORMAL LOW (ref 3.87–5.11)
RDW: 14.9 % (ref 11.5–15.5)
WBC: 12.2 10*3/uL — ABNORMAL HIGH (ref 4.0–10.5)
nRBC: 0 % (ref 0.0–0.2)

## 2024-03-06 LAB — PROTEIN / CREATININE RATIO, URINE
Creatinine, Urine: 34 mg/dL
Total Protein, Urine: <6 mg/dL

## 2024-03-06 MED ORDER — IRON SUCROSE 300 MG IVPB - SIMPLE MED
300.0000 mg | Freq: Once | Status: AC
  Administered 2024-03-06 (×2): 300 mg via INTRAVENOUS
  Filled 2024-03-06: qty 300

## 2024-03-06 MED ORDER — SODIUM CHLORIDE 0.9 % IV SOLN: INTRAVENOUS | Status: AC | PRN

## 2024-03-06 MED ORDER — NIFEDIPINE ER OSMOTIC RELEASE 30 MG PO TB24
60.0000 mg | ORAL_TABLET | Freq: Every day | ORAL | Status: DC
  Administered 2024-03-07: 60 mg via ORAL
  Filled 2024-03-06: qty 2

## 2024-03-06 MED ORDER — NIFEDIPINE ER OSMOTIC RELEASE 30 MG PO TB24
30.0000 mg | ORAL_TABLET | Freq: Every day | ORAL | Status: DC
  Administered 2024-03-06: 30 mg via ORAL
  Filled 2024-03-06: qty 1

## 2024-03-06 MED ORDER — ACETAMINOPHEN 500 MG PO TABS
1000.0000 mg | ORAL_TABLET | Freq: Four times a day (QID) | ORAL | Status: DC | PRN
  Administered 2024-03-06 – 2024-03-07 (×2): 1000 mg via ORAL
  Filled 2024-03-06 (×3): qty 2

## 2024-03-06 MED ORDER — NIFEDIPINE ER OSMOTIC RELEASE 30 MG PO TB24
30.0000 mg | ORAL_TABLET | Freq: Once | ORAL | Status: AC
  Administered 2024-03-06: 30 mg via ORAL
  Filled 2024-03-06: qty 1

## 2024-03-06 NOTE — Lactation Note (Signed)
 This note was copied from a baby's chart. Lactation Consultation Note  Patient Name: Boy Meron Wool [id number] Date: 03/06/2024 Age:23 hours Reason for consult: Initial assessment;Early term 37-38.6wks;Other (Comment)   Maternal Data Does the patient have breastfeeding experience prior to this delivery?: Yes How long did the patient breastfeed?: 2nd child for 6 months  Initial assessment w/ a P3 patient and a 25hr old baby boy.  This was a SVD.  Patient w/ a hx of PPH and pre-E.   Patient stated that she feels feedings are going ok but she is experiencing pain at the latch. MOB breastfed her 2nd child for 6 months with no problems.  Feeding Mother's Current Feeding Choice: Breast Milk  Patient fed infant with LC in the room to observe and assess the latch.  Patient has good positioning and handling of infant at the breast.  LC provided some tips with extra support pillows, facing infants tummy towards her and sandwiching breast while feeding.  Infant latched with bottom lip slightly turned in.  LC made adjustments to infant lip and patient stated she didn't experience any pain during the feeding. Audible swallows were heard.  Interventions Interventions: Breast feeding basics reviewed;Assisted with latch;Education  LC provided education on the following;  milk production expectations, hunger cues, day 1/2 wet/dirty diapers, hand expression, cluster feeding, benefits of STS and arousing infant for a feeding.  Lactation informed patient of feeding infant at least 8 or more times w/in a 24hr period but not exceeding 3hrs. Patient verbalized understanding.   Discharge Discharge Education: Engorgement and breast care Pump: Personal;DEBP  LC discussed engorgement prevention/treatment with patient.  Consult Status Consult Status: Follow-up Follow-up type: In-patient    Symeon Puleo S Marcelino Campos 03/06/2024, 2:17 PM

## 2024-03-06 NOTE — Progress Notes (Signed)
 Postpartum Day  1  Subjective: 23 y.o. [id number] postpartum day #1 status post normal spontaneous vaginal delivery. She is ambulating, is tolerating po, is voiding spontaneously.  Her pain is well controlled on PO pain medications. With the exception of a HA she rates at a 4/10 Her lochia is less than menses.  Objective: BP (!) 143/99   Pulse 68   Temp 98.5 F (36.9 C) (Oral)   Resp 18   Ht 5\' 1"  (1.549 m)   Wt 76.2 kg   LMP 06/03/2023 (Exact Date)   SpO2 100%   Breastfeeding Unknown   BMI 31.74 kg/m    Physical Exam:  General: alert, cooperative, and appears stated age Breasts: soft/nontender Pulm: nl effort Abdomen: soft, non-tender, active bowel sounds Uterine Fundus: firm Perineum: minimal edema, intact Lochia: appropriate DVT Evaluation: No evidence of DVT seen on physical exam. Negative Homan's sign. No cords or calf tenderness. No significant calf/ankle edema.  Recent Labs    03/05/24 1545 03/06/24 0347  HGB 9.4* 7.6*  HCT 28.9* 23.0*  WBC 14.9* 12.2*  PLT 166 134*    Assessment/Plan: 22 y.o. [id number] Postpartum Day  1  1. Continue routine postpartum care  2. Infant feeding status: breast feeding --Lactation consult PRN for breastfeeding    3. Contraception plan: no method  4. Acute blood loss anemia - clinically significant.  --Hemodynamically stable and symptomatic --Intervention: start on oral supplementation with ferrous sulfate  325 mg , IV iron transfusion with venofer ordered , and IV iron transfusion with venofer given   5. Immunization status:   all immunizations up to date  6. Elevated mild range blood pressures -increased Procardia  xl from 30 mg to 60 mg  Disposition: desires discharge home today , plan for discharge home tomorrow    LOS: 2 days   Jenesys Casseus, CNM 03/06/2024, 1:43 PM   ----- Arzella Laurence Certified Nurse Midwife [facility]

## 2024-03-06 NOTE — Anesthesia Postprocedure Evaluation (Signed)
 Anesthesia Post Note  Patient: Carol Schmitt  Procedure(s) Performed: AN AD HOC LABOR EPIDURAL  Patient location during evaluation: Mother Baby Anesthesia Type: Epidural Level of consciousness: oriented and awake and alert Pain management: pain level controlled Vital Signs Assessment: post-procedure vital signs reviewed and stable Respiratory status: spontaneous breathing and respiratory function stable Cardiovascular status: blood pressure returned to baseline and stable Postop Assessment: no headache, no backache, no apparent nausea or vomiting and able to ambulate Anesthetic complications: no   No notable events documented.   Last Vitals:  Vitals:   03/06/24 0321 03/06/24 0812  BP: 126/76   Pulse: 79 67  Resp: 18 18  Temp: 37.8 C 36.9 C  SpO2: 98% 99%    Last Pain:  Vitals:   03/06/24 0812  TempSrc: Oral  PainSc: 3                  Sabas Cradle

## 2024-03-07 MED ORDER — NIFEDIPINE ER 60 MG PO TB24
60.0000 mg | ORAL_TABLET | Freq: Every day | ORAL | 5 refills | Status: AC
Start: 2024-03-07 — End: ?
